# Patient Record
Sex: Female | Born: 1980 | Race: White | Hispanic: No | Smoking: Current every day smoker
Health system: Southern US, Community
[De-identification: ages and names within clinical notes are randomized; demographics above are authoritative.]

## PROBLEM LIST (undated history)

## (undated) DIAGNOSIS — F32A Depression, unspecified: Secondary | ICD-10-CM

## (undated) DIAGNOSIS — M549 Dorsalgia, unspecified: Secondary | ICD-10-CM

## (undated) DIAGNOSIS — F329 Major depressive disorder, single episode, unspecified: Secondary | ICD-10-CM

## (undated) DIAGNOSIS — F419 Anxiety disorder, unspecified: Secondary | ICD-10-CM

## (undated) DIAGNOSIS — R55 Syncope and collapse: Secondary | ICD-10-CM

## (undated) DIAGNOSIS — R51 Headache: Secondary | ICD-10-CM

## (undated) DIAGNOSIS — R519 Headache, unspecified: Secondary | ICD-10-CM

## (undated) DIAGNOSIS — I951 Orthostatic hypotension: Secondary | ICD-10-CM

## (undated) HISTORY — DX: Syncope and collapse: R55

## (undated) HISTORY — PX: TONSILLECTOMY AND ADENOIDECTOMY: SUR1326

## (undated) HISTORY — DX: Headache, unspecified: R51.9

## (undated) HISTORY — DX: Orthostatic hypotension: I95.1

## (undated) HISTORY — PX: APPENDECTOMY: SHX54

## (undated) HISTORY — DX: Headache: R51

## (undated) HISTORY — PX: FACIAL RECONSTRUCTION SURGERY: SHX631

---

## 2009-01-18 ENCOUNTER — Emergency Department: Payer: Self-pay | Admitting: Emergency Medicine

## 2011-12-13 ENCOUNTER — Encounter (HOSPITAL_COMMUNITY): Payer: Self-pay | Admitting: *Deleted

## 2011-12-13 ENCOUNTER — Emergency Department (HOSPITAL_COMMUNITY)
Admission: EM | Admit: 2011-12-13 | Discharge: 2011-12-13 | Disposition: A | Discharge status: Home / Self Care | Payer: 59 | Source: Home / Self Care | Attending: Emergency Medicine | Admitting: Emergency Medicine

## 2011-12-13 DIAGNOSIS — J329 Chronic sinusitis, unspecified: Secondary | ICD-10-CM

## 2011-12-13 MED ORDER — AMOXICILLIN-POT CLAVULANATE 875-125 MG PO TABS: 1.0000 | ORAL_TABLET | Freq: Two times a day (BID) | ORAL | Status: AC

## 2011-12-13 NOTE — ED Notes (Signed)
Pt  Reports  She  Had  Facial  Surgery    Last  Year  -  She  Reports   She  Had  Complications  From the  Surgery  -  She   Reports  Several  Days  Ago  She  Felt  Some  Pressure  In  Her   Face      With  Pain and  Swelling  -  She   denys  Having  Any     Uri      Symptoms           She    Is  Sitting  Upright on  Exam table   Speaking in  Complete  sentances          She  Reports  She  Has  An appt in  Early  July  With  The    Surgeon

## 2011-12-13 NOTE — Discharge Instructions (Signed)
Follow up with your doctor in [facility] as scheduled.  Take all of the augmentin even if you are feeling better.  Return here or your regular doctor if your symptoms worsen.    Sinusitis Sinuses are air pockets within the bones of your face. The growth of bacteria within a sinus leads to infection. The infection prevents the sinuses from draining. This infection is called sinusitis. SYMPTOMS  There will be different areas of pain depending on which sinuses have become infected.  The maxillary sinuses often produce pain beneath the eyes.   Frontal sinusitis may cause pain in the middle of the forehead and above the eyes.  Other problems (symptoms) include:  Toothaches.   Colored, pus-like (purulent) drainage from the nose.   Swelling, warmth, and tenderness over the sinus areas may be signs of infection.  TREATMENT  Sinusitis is most often determined by an exam.X-rays may be taken. If x-rays have been taken, make sure you obtain your results or find out how you are to obtain them. Your caregiver may give you medications (antibiotics). These are medications that will help kill the bacteria causing the infection. You may also be given a medication (decongestant) that helps to reduce sinus swelling.  HOME CARE INSTRUCTIONS   Only take over-the-counter or prescription medicines for pain, discomfort, or fever as directed by your caregiver.   Drink extra fluids. Fluids help thin the mucus so your sinuses can drain more easily.   Applying either moist heat or ice packs to the sinus areas may help relieve discomfort.   Use saline nasal sprays to help moisten your sinuses. The sprays can be found at your local drugstore.  SEEK IMMEDIATE MEDICAL CARE IF:  You have a fever.   You have increasing pain, severe headaches, or toothache.   You have nausea, vomiting, or drowsiness.   You develop unusual swelling around the face or trouble seeing.  MAKE SURE YOU:   Understand these  instructions.   Will watch your condition.   Will get help right away if you are not doing well or get worse.  Document Released: 06/20/2005 Document Revised: 06/09/2011 Document Reviewed: 01/17/2007 [facility] Patient Information 2012 [place], [place].

## 2011-12-13 NOTE — ED Provider Notes (Signed)
Medical screening examination/treatment/procedure(s) were performed by non-physician practitioner and as supervising physician I was immediately available for consultation/collaboration.  Leslee Home, M.D.   Reuben Likes, MD 12/13/11 2118
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
## 2012-01-04 ENCOUNTER — Other Ambulatory Visit (HOSPITAL_COMMUNITY): Payer: Self-pay | Admitting: Dentistry

## 2012-01-04 ENCOUNTER — Ambulatory Visit (HOSPITAL_COMMUNITY)
Admission: RE | Admit: 2012-01-04 | Discharge: 2012-01-04 | Disposition: A | Discharge status: Home / Self Care | Payer: 59 | Source: Ambulatory Visit | Attending: Dentistry | Admitting: Dentistry

## 2012-01-04 DIAGNOSIS — X58XXXA Exposure to other specified factors, initial encounter: Secondary | ICD-10-CM | POA: Insufficient documentation

## 2012-01-04 DIAGNOSIS — Z1810 Retained metal fragments, unspecified: Secondary | ICD-10-CM | POA: Insufficient documentation

## 2012-01-04 DIAGNOSIS — IMO0002 Reserved for concepts with insufficient information to code with codable children: Secondary | ICD-10-CM

## 2012-01-04 DIAGNOSIS — T1490XA Injury, unspecified, initial encounter: Secondary | ICD-10-CM | POA: Insufficient documentation

## 2012-05-12 ENCOUNTER — Emergency Department (HOSPITAL_COMMUNITY): Payer: No Typology Code available for payment source

## 2012-05-12 ENCOUNTER — Emergency Department (HOSPITAL_COMMUNITY)
Admission: EM | Admit: 2012-05-12 | Discharge: 2012-05-12 | Disposition: A | Discharge status: Home / Self Care | Payer: No Typology Code available for payment source | Attending: Emergency Medicine | Admitting: Emergency Medicine

## 2012-05-12 ENCOUNTER — Encounter (HOSPITAL_COMMUNITY): Payer: Self-pay | Admitting: Physical Medicine and Rehabilitation

## 2012-05-12 DIAGNOSIS — T07XXXA Unspecified multiple injuries, initial encounter: Secondary | ICD-10-CM | POA: Insufficient documentation

## 2012-05-12 DIAGNOSIS — Z3202 Encounter for pregnancy test, result negative: Secondary | ICD-10-CM | POA: Insufficient documentation

## 2012-05-12 DIAGNOSIS — R109 Unspecified abdominal pain: Secondary | ICD-10-CM | POA: Insufficient documentation

## 2012-05-12 DIAGNOSIS — Y92410 Unspecified street and highway as the place of occurrence of the external cause: Secondary | ICD-10-CM | POA: Insufficient documentation

## 2012-05-12 DIAGNOSIS — M549 Dorsalgia, unspecified: Secondary | ICD-10-CM | POA: Insufficient documentation

## 2012-05-12 DIAGNOSIS — R079 Chest pain, unspecified: Secondary | ICD-10-CM | POA: Insufficient documentation

## 2012-05-12 DIAGNOSIS — Y9389 Activity, other specified: Secondary | ICD-10-CM | POA: Insufficient documentation

## 2012-05-12 LAB — URINE MICROSCOPIC-ADD ON

## 2012-05-12 LAB — POCT I-STAT, CHEM 8
Creatinine, Ser: 0.8 mg/dL (ref 0.50–1.10)
Glucose, Bld: 103 mg/dL — ABNORMAL HIGH (ref 70–99)
HCT: 40 % (ref 36.0–46.0)
Potassium: 3.8 mEq/L (ref 3.5–5.1)

## 2012-05-12 LAB — URINALYSIS, ROUTINE W REFLEX MICROSCOPIC
Nitrite: NEGATIVE
Specific Gravity, Urine: 1.035 — ABNORMAL HIGH (ref 1.005–1.030)
Urobilinogen, UA: 0.2 mg/dL (ref 0.0–1.0)

## 2012-05-12 LAB — POCT PREGNANCY, URINE: Preg Test, Ur: NEGATIVE

## 2012-05-12 MED ORDER — IOHEXOL 300 MG/ML  SOLN
100.0000 mL | Freq: Once | INTRAMUSCULAR | Status: AC | PRN
  Administered 2012-05-12: 100 mL via INTRAVENOUS

## 2012-05-12 MED ORDER — HYDROCODONE-ACETAMINOPHEN 5-325 MG PO TABS: 1.0000 | ORAL_TABLET | ORAL | Status: DC | PRN

## 2012-05-12 MED ORDER — IBUPROFEN 800 MG PO TABS: 800.0000 mg | ORAL_TABLET | Freq: Three times a day (TID) | ORAL | Status: DC | PRN

## 2012-05-12 MED ORDER — DIAZEPAM 5 MG PO TABS: 5.0000 mg | ORAL_TABLET | Freq: Three times a day (TID) | ORAL | Status: DC | PRN
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
## 2012-05-12 NOTE — ED Notes (Signed)
Family at bedside. 
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
## 2012-05-12 NOTE — Progress Notes (Signed)
Orthopedic Tech Progress Note Patient Details:  Sandra Myers 08/16/1980 [id number]  Ortho Devices Type of Ortho Device: Velcro wrist splint Ortho Device/Splint Location: left wrist Ortho Device/Splint Interventions: Application   [name] 05/12/2012, 2:19 PM
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
## 2012-05-12 NOTE — ED Provider Notes (Signed)
Medical screening examination/treatment/procedure(s) were conducted as a shared visit with non-physician practitioner(s) and myself.  I personally evaluated the patient during the encounter   Karry Causer, MD 05/12/12 1634 
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
## 2012-06-29 ENCOUNTER — Other Ambulatory Visit (HOSPITAL_COMMUNITY): Payer: Self-pay | Admitting: Emergency Medicine

## 2012-06-29 ENCOUNTER — Ambulatory Visit (HOSPITAL_COMMUNITY)
Admission: RE | Admit: 2012-06-29 | Discharge: 2012-06-29 | Disposition: A | Discharge status: Home / Self Care | Payer: 59 | Source: Ambulatory Visit | Attending: Emergency Medicine | Admitting: Emergency Medicine

## 2012-06-29 DIAGNOSIS — S6990XA Unspecified injury of unspecified wrist, hand and finger(s), initial encounter: Secondary | ICD-10-CM | POA: Insufficient documentation

## 2012-06-29 DIAGNOSIS — R52 Pain, unspecified: Secondary | ICD-10-CM

## 2012-06-29 DIAGNOSIS — X58XXXA Exposure to other specified factors, initial encounter: Secondary | ICD-10-CM | POA: Insufficient documentation

## 2012-08-23 ENCOUNTER — Ambulatory Visit: Payer: PRIVATE HEALTH INSURANCE | Attending: Family Medicine

## 2012-08-23 DIAGNOSIS — IMO0001 Reserved for inherently not codable concepts without codable children: Secondary | ICD-10-CM | POA: Insufficient documentation

## 2012-08-23 DIAGNOSIS — M545 Low back pain, unspecified: Secondary | ICD-10-CM | POA: Insufficient documentation

## 2012-08-23 DIAGNOSIS — R5381 Other malaise: Secondary | ICD-10-CM | POA: Insufficient documentation

## 2012-08-28 ENCOUNTER — Ambulatory Visit: Payer: PRIVATE HEALTH INSURANCE | Admitting: Physical Therapy

## 2012-08-30 ENCOUNTER — Ambulatory Visit: Payer: PRIVATE HEALTH INSURANCE

## 2012-09-03 ENCOUNTER — Ambulatory Visit: Payer: 59 | Admitting: Physical Therapy

## 2012-09-03 ENCOUNTER — Ambulatory Visit: Payer: PRIVATE HEALTH INSURANCE | Attending: Family Medicine | Admitting: Physical Therapy

## 2012-09-03 DIAGNOSIS — R5381 Other malaise: Secondary | ICD-10-CM | POA: Insufficient documentation

## 2012-09-03 DIAGNOSIS — M545 Low back pain, unspecified: Secondary | ICD-10-CM | POA: Insufficient documentation

## 2012-09-03 DIAGNOSIS — IMO0001 Reserved for inherently not codable concepts without codable children: Secondary | ICD-10-CM | POA: Insufficient documentation

## 2012-09-05 ENCOUNTER — Ambulatory Visit: Payer: PRIVATE HEALTH INSURANCE | Admitting: Physical Therapy

## 2012-09-10 ENCOUNTER — Ambulatory Visit: Payer: PRIVATE HEALTH INSURANCE | Admitting: Physical Therapy

## 2012-09-12 ENCOUNTER — Ambulatory Visit: Payer: PRIVATE HEALTH INSURANCE | Admitting: Physical Therapy

## 2012-09-17 ENCOUNTER — Ambulatory Visit: Payer: PRIVATE HEALTH INSURANCE | Admitting: Physical Therapy

## 2012-09-19 ENCOUNTER — Ambulatory Visit: Payer: Self-pay

## 2012-09-19 ENCOUNTER — Other Ambulatory Visit: Payer: Self-pay | Admitting: Occupational Medicine

## 2012-09-19 ENCOUNTER — Ambulatory Visit: Payer: PRIVATE HEALTH INSURANCE | Admitting: Physical Therapy

## 2012-09-19 DIAGNOSIS — M549 Dorsalgia, unspecified: Secondary | ICD-10-CM

## 2012-09-20 ENCOUNTER — Other Ambulatory Visit (HOSPITAL_COMMUNITY): Payer: Self-pay | Admitting: Family Medicine

## 2012-09-21 ENCOUNTER — Encounter (HOSPITAL_COMMUNITY): Payer: Self-pay

## 2012-09-21 ENCOUNTER — Ambulatory Visit (HOSPITAL_COMMUNITY)
Admission: RE | Admit: 2012-09-21 | Discharge: 2012-09-21 | Disposition: A | Discharge status: Home / Self Care | Payer: PRIVATE HEALTH INSURANCE | Source: Ambulatory Visit | Attending: Family Medicine | Admitting: Family Medicine

## 2012-09-21 VITALS — BP 124/80 | HR 85 | Temp 98.4°F | Resp 16

## 2012-09-21 DIAGNOSIS — T148XXA Other injury of unspecified body region, initial encounter: Secondary | ICD-10-CM

## 2012-09-21 DIAGNOSIS — M25569 Pain in unspecified knee: Secondary | ICD-10-CM | POA: Insufficient documentation

## 2012-09-21 DIAGNOSIS — M545 Low back pain, unspecified: Secondary | ICD-10-CM | POA: Insufficient documentation

## 2012-09-21 DIAGNOSIS — M25559 Pain in unspecified hip: Secondary | ICD-10-CM | POA: Insufficient documentation

## 2012-09-21 HISTORY — DX: Dorsalgia, unspecified: M54.9

## 2012-09-21 MED ORDER — MIDAZOLAM HCL 2 MG/2ML IJ SOLN
INTRAMUSCULAR | Status: AC
  Filled 2012-09-21: qty 10

## 2012-09-21 MED ORDER — FENTANYL CITRATE 0.05 MG/ML IJ SOLN
25.0000 ug | INTRAMUSCULAR | Status: DC | PRN
  Administered 2012-09-21 (×4): 50 ug via INTRAVENOUS
  Filled 2012-09-21: qty 4

## 2012-09-21 MED ORDER — FENTANYL CITRATE 0.05 MG/ML IJ SOLN
INTRAMUSCULAR | Status: AC
  Filled 2012-09-21: qty 4

## 2012-09-21 MED ORDER — MIDAZOLAM HCL 2 MG/2ML IJ SOLN
1.0000 mg | INTRAMUSCULAR | Status: DC | PRN
  Administered 2012-09-21 (×5): 2 mg via INTRAVENOUS
  Filled 2012-09-21: qty 10
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
## 2012-09-24 ENCOUNTER — Other Ambulatory Visit (HOSPITAL_COMMUNITY): Payer: Self-pay

## 2012-09-24 ENCOUNTER — Ambulatory Visit: Payer: PRIVATE HEALTH INSURANCE | Admitting: Physical Therapy

## 2012-09-26 ENCOUNTER — Ambulatory Visit: Payer: PRIVATE HEALTH INSURANCE

## 2012-10-10 ENCOUNTER — Ambulatory Visit: Payer: PRIVATE HEALTH INSURANCE | Attending: Family Medicine | Admitting: Physical Therapy

## 2012-10-10 DIAGNOSIS — M545 Low back pain, unspecified: Secondary | ICD-10-CM | POA: Insufficient documentation

## 2012-10-10 DIAGNOSIS — R5381 Other malaise: Secondary | ICD-10-CM | POA: Insufficient documentation

## 2012-10-10 DIAGNOSIS — IMO0001 Reserved for inherently not codable concepts without codable children: Secondary | ICD-10-CM | POA: Insufficient documentation

## 2012-10-15 ENCOUNTER — Ambulatory Visit: Payer: PRIVATE HEALTH INSURANCE | Admitting: Physical Therapy

## 2012-10-17 ENCOUNTER — Ambulatory Visit: Payer: PRIVATE HEALTH INSURANCE | Admitting: Physical Therapy

## 2012-10-23 ENCOUNTER — Ambulatory Visit: Payer: PRIVATE HEALTH INSURANCE | Admitting: Physical Therapy

## 2012-10-24 ENCOUNTER — Ambulatory Visit: Payer: PRIVATE HEALTH INSURANCE | Admitting: Physical Therapy

## 2012-10-30 ENCOUNTER — Ambulatory Visit: Payer: PRIVATE HEALTH INSURANCE | Admitting: Physical Therapy

## 2012-10-31 ENCOUNTER — Ambulatory Visit: Payer: PRIVATE HEALTH INSURANCE | Admitting: Physical Therapy

## 2012-11-05 ENCOUNTER — Ambulatory Visit: Payer: PRIVATE HEALTH INSURANCE | Attending: Family Medicine | Admitting: Physical Therapy

## 2012-11-05 DIAGNOSIS — M545 Low back pain, unspecified: Secondary | ICD-10-CM | POA: Insufficient documentation

## 2012-11-05 DIAGNOSIS — IMO0001 Reserved for inherently not codable concepts without codable children: Secondary | ICD-10-CM | POA: Insufficient documentation

## 2012-11-05 DIAGNOSIS — R5381 Other malaise: Secondary | ICD-10-CM | POA: Insufficient documentation

## 2012-11-07 ENCOUNTER — Ambulatory Visit: Payer: PRIVATE HEALTH INSURANCE

## 2013-01-12 ENCOUNTER — Emergency Department (HOSPITAL_BASED_OUTPATIENT_CLINIC_OR_DEPARTMENT_OTHER)
Admission: EM | Admit: 2013-01-12 | Discharge: 2013-01-13 | Disposition: A | Discharge status: Home / Self Care | Payer: 59 | Attending: Emergency Medicine | Admitting: Emergency Medicine

## 2013-01-12 ENCOUNTER — Emergency Department (HOSPITAL_BASED_OUTPATIENT_CLINIC_OR_DEPARTMENT_OTHER): Payer: 59

## 2013-01-12 ENCOUNTER — Encounter (HOSPITAL_BASED_OUTPATIENT_CLINIC_OR_DEPARTMENT_OTHER): Payer: Self-pay | Admitting: *Deleted

## 2013-01-12 DIAGNOSIS — R42 Dizziness and giddiness: Secondary | ICD-10-CM | POA: Insufficient documentation

## 2013-01-12 DIAGNOSIS — Y939 Activity, unspecified: Secondary | ICD-10-CM | POA: Insufficient documentation

## 2013-01-12 DIAGNOSIS — W57XXXA Bitten or stung by nonvenomous insect and other nonvenomous arthropods, initial encounter: Secondary | ICD-10-CM | POA: Insufficient documentation

## 2013-01-12 DIAGNOSIS — R197 Diarrhea, unspecified: Secondary | ICD-10-CM | POA: Insufficient documentation

## 2013-01-12 DIAGNOSIS — R0602 Shortness of breath: Secondary | ICD-10-CM | POA: Insufficient documentation

## 2013-01-12 DIAGNOSIS — R110 Nausea: Secondary | ICD-10-CM | POA: Insufficient documentation

## 2013-01-12 DIAGNOSIS — S30860A Insect bite (nonvenomous) of lower back and pelvis, initial encounter: Secondary | ICD-10-CM | POA: Insufficient documentation

## 2013-01-12 DIAGNOSIS — Y929 Unspecified place or not applicable: Secondary | ICD-10-CM | POA: Insufficient documentation

## 2013-01-12 DIAGNOSIS — J9801 Acute bronchospasm: Secondary | ICD-10-CM | POA: Insufficient documentation

## 2013-01-12 DIAGNOSIS — Z87891 Personal history of nicotine dependence: Secondary | ICD-10-CM | POA: Insufficient documentation

## 2013-01-12 LAB — CBC WITH DIFFERENTIAL/PLATELET
Basophils Absolute: 0 10*3/uL (ref 0.0–0.1)
Lymphocytes Relative: 21 % (ref 12–46)
Lymphs Abs: 2.8 10*3/uL (ref 0.7–4.0)
Neutro Abs: 8.8 10*3/uL — ABNORMAL HIGH (ref 1.7–7.7)
Neutrophils Relative %: 67 % (ref 43–77)
Platelets: 216 10*3/uL (ref 150–400)
RBC: 4.53 MIL/uL (ref 3.87–5.11)
RDW: 12.5 % (ref 11.5–15.5)
WBC: 13.2 10*3/uL — ABNORMAL HIGH (ref 4.0–10.5)

## 2013-01-12 MED ORDER — ALBUTEROL SULFATE (5 MG/ML) 0.5% IN NEBU
5.0000 mg | INHALATION_SOLUTION | Freq: Once | RESPIRATORY_TRACT | Status: AC
  Administered 2013-01-12: 5 mg via RESPIRATORY_TRACT
  Filled 2013-01-12 (×2): qty 0.5

## 2013-01-12 MED ORDER — DIPHENHYDRAMINE HCL 50 MG/ML IJ SOLN
25.0000 mg | Freq: Once | INTRAMUSCULAR | Status: AC
  Administered 2013-01-13: 25 mg via INTRAVENOUS
  Filled 2013-01-12: qty 1

## 2013-01-12 MED ORDER — IPRATROPIUM BROMIDE 0.02 % IN SOLN
0.5000 mg | Freq: Once | RESPIRATORY_TRACT | Status: AC
  Administered 2013-01-12: 0.5 mg via RESPIRATORY_TRACT
  Filled 2013-01-12: qty 2.5

## 2013-01-12 NOTE — ED Provider Notes (Signed)
History     This chart was scribed for Hanley Seamen, MD by Jiles Prows, ED Scribe. The patient was seen in room MH11/MH11 and the patient's care was started at 11:17 PM.  CSN: [id number] Arrival date & time 01/12/13  2115  Chief Complaint  Patient presents with  . Chest Pain   The history is provided by the patient and medical records. No language interpreter was used.   HPI Comments: Sandra Myers is a 32 y.o. female who presents to the Emergency Department complaining of dizziness (onset noon today), chest tightness, and shortness of breath onset today around 6-7.  She ranks the chest tightness at a 7-8/10 and states it is "hard to take a deep breath."  She describes associated nausea, but denies any vomiting.  Pt reports bug bite to left buttock last Sunday that has become red and spread to both buttocks.  She claims there is a welt in the middle of the bite area.  Pt reports using benadryl cream on rash area with no relief.  Pt reports nausea (onset today) and diarrhea (onset 2 days ago).  Pt denies headache, diaphoresis, fever, chills, vomiting, weakness, cough and any other pain.   Past Medical History  Diagnosis Date  . Back pain    Past Surgical History  Procedure Laterality Date  . Facial reconstruction surgery    . Appendectomy    . Tonsillectomy and adenoidectomy     History reviewed. No pertinent family history. History  Substance Use Topics  . Smoking status: Former Smoker    Types: Cigarettes    Quit date: 01/16/2003  . Smokeless tobacco: Not on file  . Alcohol Use: Yes     Comment: OCCASIONALLY   OB History   Grav Para Term Preterm Abortions TAB SAB Ect Mult Living                 Review of Systems  Respiratory: Positive for chest tightness and shortness of breath.   Skin: Positive for rash.  Neurological: Positive for dizziness.  All other systems reviewed and are negative.    Allergies  Review of patient's allergies indicates no known  allergies.  Home Medications   Current Outpatient Rx  Name  Route  Sig  Dispense  Refill  . meloxicam (MOBIC) 15 MG tablet   Oral   Take 15 mg by mouth daily.          BP 145/94  Pulse 89  Temp(Src) 98.3 F (36.8 C) (Oral)  Resp 18  Ht 5\' 7"  (1.702 m)  Wt 210 lb (95.255 kg)  BMI 32.88 kg/m2  SpO2 100%  LMP 01/04/2013 Physical Exam  Nursing note and vitals reviewed. Constitutional: She is oriented to person, place, and time. She appears well-developed and well-nourished. No distress.  HENT:  Head: Normocephalic and atraumatic.  Eyes: EOM are normal.  Neck: Neck supple. No tracheal deviation present.  Cardiovascular: Normal rate, regular rhythm and normal heart sounds.  Exam reveals no gallop and no friction rub.   No murmur heard. Pulmonary/Chest: Effort normal and breath sounds normal. No respiratory distress. She has no wheezes. She has no rales. She exhibits no tenderness.  Lungs are clear, but she cannot take a full deep breath.  Abdominal: She exhibits no distension. There is no tenderness. There is no rebound.  Musculoskeletal: Normal range of motion.  Neurological: She is alert and oriented to person, place, and time.  Skin: Skin is warm and dry. Rash noted.  Erythematous  plaques of left buttock and superior aspect of naval cleft.  Mildly indurated, nontender.    Psychiatric: She has a normal mood and affect. Her behavior is normal.    ED Course  Procedures (including critical care time) DIAGNOSTIC STUDIES: Oxygen Saturation is 100% on RA, normal by my interpretation.    COORDINATION OF CARE: 11:24 PM - Discussed ED treatment with pt at bedside including antibiotics, blood work, EKG, and breathing treatment and pt agrees.    MDM   Nursing notes and vitals signs, including pulse oximetry, reviewed.  Summary of this visit's results, reviewed by myself:  Labs:  Results for orders placed during the hospital encounter of 01/12/13 (from the past 24 hour(s))   CBC WITH DIFFERENTIAL     Status: Abnormal   Collection Time    01/12/13 11:40 PM      Result Value Range   WBC 13.2 (*) 4.0 - 10.5 K/uL   RBC 4.53  3.87 - 5.11 MIL/uL   Hemoglobin 13.8  12.0 - 15.0 g/dL   HCT 84.6  96.2 - 95.2 %   MCV 87.4  78.0 - 100.0 fL   MCH 30.5  26.0 - 34.0 pg   MCHC 34.8  30.0 - 36.0 g/dL   RDW 84.1  32.4 - 40.1 %   Platelets 216  150 - 400 K/uL   Neutrophils Relative % 67  43 - 77 %   Neutro Abs 8.8 (*) 1.7 - 7.7 K/uL   Lymphocytes Relative 21  12 - 46 %   Lymphs Abs 2.8  0.7 - 4.0 K/uL   Monocytes Relative 10  3 - 12 %   Monocytes Absolute 1.3 (*) 0.1 - 1.0 K/uL   Eosinophils Relative 2  0 - 5 %   Eosinophils Absolute 0.3  0.0 - 0.7 K/uL   Basophils Relative 0  0 - 1 %   Basophils Absolute 0.0  0.0 - 0.1 K/uL  BASIC METABOLIC PANEL     Status: Abnormal   Collection Time    01/12/13 11:40 PM      Result Value Range   Sodium 137  135 - 145 mEq/L   Potassium 3.6  3.5 - 5.1 mEq/L   Chloride 103  96 - 112 mEq/L   CO2 20  19 - 32 mEq/L   Glucose, Bld 94  70 - 99 mg/dL   BUN 19  6 - 23 mg/dL   Creatinine, Ser 0.27  0.50 - 1.10 mg/dL   Calcium 9.9  8.4 - 25.3 mg/dL   GFR calc non Af Amer 82 (*) >90 mL/min   GFR calc Af Amer >90  >90 mL/min  TROPONIN I     Status: None   Collection Time    01/12/13 11:40 PM      Result Value Range   Troponin I <0.30  <0.30 ng/mL    Imaging Studies: Dg Chest 2 View  01/13/2013   *RADIOLOGY REPORT*  Clinical Data: Chest tightness, shortness of breath and dizziness.  CHEST - 2 VIEW  Comparison: 05/12/2012.  Findings: Normal sized heart.  Clear lungs.  Normal appearing bones.  IMPRESSION: Normal examination.   Original Report Authenticated By: Beckie Salts, M.D.       Date: 01/12/2013 11:32 PM  Rate: 82  Rhythm: normal sinus rhythm  QRS Axis: normal  Intervals: normal  ST/T Wave abnormalities: normal  Conduction Disutrbances: none  Narrative Interpretation: unremarkable  Comparison with previous EKG: none  available  12:33 AM Patient feels better  with improved air movement and reduced chest tightness after albuterol and Atrovent neb treatment. We will treat her rash with doxycycline as this may represent an atypical presentation of Lyme disease; doxycycline should also treat cellulitis although lack of tenderness and warmth is less consistent with cellulitis. The patient's nausea and dizziness have improved with IV Benadryl. She was advised to continue taking Benadryl by mouth as needed for itching or dizziness.     I personally performed the services described in this documentation, which was scribed in my presence.  The recorded information has been reviewed and is accurate.    Hanley Seamen, MD 01/13/13 [phone]

## 2013-01-12 NOTE — ED Notes (Signed)
Transported to xray 

## 2013-01-12 NOTE — ED Notes (Signed)
Patient to restroom without difficulty or assistance

## 2013-01-12 NOTE — ED Notes (Signed)
MD with pt  

## 2013-01-12 NOTE — ED Notes (Signed)
Further assessment of "bug bite" to buttocks. Several red areas noted that appear to have some clustering of bumps. Pt states the areas itch. Denies hx of shingles.

## 2013-01-12 NOTE — ED Provider Notes (Signed)
9:29 PM  Date: 01/12/2013  Rate: 86  Rhythm: normal sinus rhythm  QRS Axis: normal  Intervals: normal  ST/T Wave abnormalities: normal  Conduction Disutrbances:none  Narrative Interpretation: Normal EKG  Old EKG Reviewed: none available    Carleene Cooper III, MD 01/12/13 2129

## 2013-01-12 NOTE — ED Notes (Signed)
Pt states she was ? Bitten by an insect last Sunday and has now developed a rash. Also describes dizziness, chest tightness, SHOB onset 12 noon. Taken to ED 11. EKG done.

## 2013-01-13 LAB — BASIC METABOLIC PANEL
CO2: 20 mEq/L (ref 19–32)
Calcium: 9.9 mg/dL (ref 8.4–10.5)
Chloride: 103 mEq/L (ref 96–112)
Potassium: 3.6 mEq/L (ref 3.5–5.1)
Sodium: 137 mEq/L (ref 135–145)

## 2013-01-13 LAB — TROPONIN I: Troponin I: <0.3 ng/mL (ref <0.30)

## 2013-01-13 MED ORDER — DOXYCYCLINE HYCLATE 100 MG PO TABS
100.0000 mg | ORAL_TABLET | Freq: Once | ORAL | Status: AC
  Administered 2013-01-13: 100 mg via ORAL
  Filled 2013-01-13: qty 1

## 2013-01-13 MED ORDER — ALBUTEROL SULFATE HFA 108 (90 BASE) MCG/ACT IN AERS
2.0000 | INHALATION_SPRAY | RESPIRATORY_TRACT | Status: DC | PRN
  Administered 2013-01-13: 2 via RESPIRATORY_TRACT
  Filled 2013-01-13: qty 6.7

## 2013-01-13 MED ORDER — DOXYCYCLINE HYCLATE 100 MG PO CAPS: 100.0000 mg | ORAL_CAPSULE | Freq: Two times a day (BID) | ORAL | Status: DC

## 2013-01-13 NOTE — ED Notes (Signed)
Returned from xray

## 2013-01-13 NOTE — ED Notes (Signed)
Breathing treatment in progress

## 2013-01-16 ENCOUNTER — Ambulatory Visit (INDEPENDENT_AMBULATORY_CARE_PROVIDER_SITE_OTHER): Payer: 59 | Admitting: Family Medicine

## 2013-01-16 VITALS — BP 118/72 | HR 82 | Temp 99.2°F | Resp 18 | Ht 67.0 in | Wt 217.4 lb

## 2013-01-16 DIAGNOSIS — F329 Major depressive disorder, single episode, unspecified: Secondary | ICD-10-CM

## 2013-01-16 DIAGNOSIS — Z131 Encounter for screening for diabetes mellitus: Secondary | ICD-10-CM

## 2013-01-16 DIAGNOSIS — R21 Rash and other nonspecific skin eruption: Secondary | ICD-10-CM

## 2013-01-16 DIAGNOSIS — F32A Depression, unspecified: Secondary | ICD-10-CM

## 2013-01-16 DIAGNOSIS — F341 Dysthymic disorder: Secondary | ICD-10-CM

## 2013-01-16 DIAGNOSIS — L0390 Cellulitis, unspecified: Secondary | ICD-10-CM

## 2013-01-16 LAB — POCT CBC
Granulocyte percent: 68.4 %G (ref 37–80)
HCT, POC: 44 % (ref 37.7–47.9)
Hemoglobin: 14.1 g/dL (ref 12.2–16.2)
Lymph, poc: 2.1 (ref 0.6–3.4)
MCH, POC: 30.1 pg (ref 27–31.2)
MCHC: 32 g/dL (ref 31.8–35.4)
MCV: 93.8 fL (ref 80–97)
MID (cbc): 0.6 (ref 0–0.9)
MPV: 9.2 fL (ref 0–99.8)
POC Granulocyte: 5.9 (ref 2–6.9)
POC LYMPH PERCENT: 24.6 %L (ref 10–50)
POC MID %: 7 % (ref 0–12)
Platelet Count, POC: 231 10*3/uL (ref 142–424)
RBC: 4.69 M/uL (ref 4.04–5.48)
RDW, POC: 13.5 %
WBC: 8.6 10*3/uL (ref 4.6–10.2)

## 2013-01-16 LAB — POCT GLYCOSYLATED HEMOGLOBIN (HGB A1C): Hemoglobin A1C: 5

## 2013-01-16 MED ORDER — CITALOPRAM HYDROBROMIDE 20 MG PO TABS: 20.0000 mg | ORAL_TABLET | Freq: Every day | ORAL | Status: DC

## 2013-01-16 MED ORDER — TRIAMCINOLONE ACETONIDE 0.1 % EX CREA: TOPICAL_CREAM | Freq: Two times a day (BID) | CUTANEOUS | Status: DC

## 2013-01-16 MED ORDER — CLONAZEPAM 0.5 MG PO TABS: 0.5000 mg | ORAL_TABLET | Freq: Two times a day (BID) | ORAL | Status: DC | PRN
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
## 2013-01-17 LAB — TSH: TSH: 1.009 u[IU]/mL (ref 0.350–4.500)

## 2013-02-05 ENCOUNTER — Inpatient Hospital Stay (HOSPITAL_COMMUNITY)
Admission: RE | Admit: 2013-02-05 | Discharge: 2013-02-08 | DRG: 885 | Disposition: A | Discharge status: Home / Self Care | Payer: 59 | Attending: Psychiatry | Admitting: Psychiatry

## 2013-02-05 ENCOUNTER — Encounter (HOSPITAL_COMMUNITY): Payer: Self-pay | Admitting: Behavioral Health

## 2013-02-05 DIAGNOSIS — F329 Major depressive disorder, single episode, unspecified: Secondary | ICD-10-CM

## 2013-02-05 DIAGNOSIS — R45851 Suicidal ideations: Secondary | ICD-10-CM

## 2013-02-05 DIAGNOSIS — F4323 Adjustment disorder with mixed anxiety and depressed mood: Secondary | ICD-10-CM

## 2013-02-05 DIAGNOSIS — F411 Generalized anxiety disorder: Secondary | ICD-10-CM | POA: Diagnosis present

## 2013-02-05 DIAGNOSIS — F332 Major depressive disorder, recurrent severe without psychotic features: Principal | ICD-10-CM | POA: Diagnosis present

## 2013-02-05 DIAGNOSIS — Z79899 Other long term (current) drug therapy: Secondary | ICD-10-CM

## 2013-02-05 HISTORY — DX: Major depressive disorder, single episode, unspecified: F32.9

## 2013-02-05 HISTORY — DX: Depression, unspecified: F32.A

## 2013-02-05 HISTORY — DX: Anxiety disorder, unspecified: F41.9

## 2013-02-05 MED ORDER — THIAMINE HCL 100 MG/ML IJ SOLN: 100.0000 mg | Freq: Once | INTRAMUSCULAR | Status: DC

## 2013-02-05 MED ORDER — ADULT MULTIVITAMIN W/MINERALS CH
1.0000 | ORAL_TABLET | Freq: Every day | ORAL | Status: DC
  Administered 2013-02-05 – 2013-02-06 (×2): 1 via ORAL
  Filled 2013-02-05 (×4): qty 1

## 2013-02-05 MED ORDER — MAGNESIUM HYDROXIDE 400 MG/5ML PO SUSP: 30.0000 mL | Freq: Every day | ORAL | Status: DC | PRN

## 2013-02-05 MED ORDER — CHLORDIAZEPOXIDE HCL 25 MG PO CAPS: 25.0000 mg | ORAL_CAPSULE | Freq: Every day | ORAL | Status: DC

## 2013-02-05 MED ORDER — ALUM & MAG HYDROXIDE-SIMETH 200-200-20 MG/5ML PO SUSP: 30.0000 mL | ORAL | Status: DC | PRN

## 2013-02-05 MED ORDER — CHLORDIAZEPOXIDE HCL 25 MG PO CAPS: 25.0000 mg | ORAL_CAPSULE | Freq: Four times a day (QID) | ORAL | Status: DC

## 2013-02-05 MED ORDER — LOPERAMIDE HCL 2 MG PO CAPS: 2.0000 mg | ORAL_CAPSULE | ORAL | Status: DC | PRN

## 2013-02-05 MED ORDER — MELOXICAM 15 MG PO TABS
15.0000 mg | ORAL_TABLET | Freq: Every day | ORAL | Status: DC
  Filled 2013-02-05 (×2): qty 1

## 2013-02-05 MED ORDER — TRAZODONE HCL 50 MG PO TABS
50.0000 mg | ORAL_TABLET | Freq: Every day | ORAL | Status: DC
  Administered 2013-02-05: 50 mg via ORAL
  Filled 2013-02-05 (×3): qty 1

## 2013-02-05 MED ORDER — CITALOPRAM HYDROBROMIDE 20 MG PO TABS
20.0000 mg | ORAL_TABLET | Freq: Every day | ORAL | Status: DC
  Administered 2013-02-06 – 2013-02-08 (×3): 20 mg via ORAL
  Filled 2013-02-05 (×4): qty 1
  Filled 2013-02-05: qty 3

## 2013-02-05 MED ORDER — ACETAMINOPHEN 325 MG PO TABS: 650.0000 mg | ORAL_TABLET | Freq: Four times a day (QID) | ORAL | Status: DC | PRN

## 2013-02-05 MED ORDER — ONDANSETRON 4 MG PO TBDP: 4.0000 mg | ORAL_TABLET | Freq: Four times a day (QID) | ORAL | Status: DC | PRN

## 2013-02-05 MED ORDER — CLONAZEPAM 0.5 MG PO TABS
0.5000 mg | ORAL_TABLET | Freq: Two times a day (BID) | ORAL | Status: DC
  Administered 2013-02-05: 0.5 mg via ORAL
  Filled 2013-02-05 (×2): qty 1

## 2013-02-05 MED ORDER — CHLORDIAZEPOXIDE HCL 25 MG PO CAPS: 25.0000 mg | ORAL_CAPSULE | ORAL | Status: DC

## 2013-02-05 MED ORDER — CHLORDIAZEPOXIDE HCL 25 MG PO CAPS: 25.0000 mg | ORAL_CAPSULE | Freq: Four times a day (QID) | ORAL | Status: DC | PRN

## 2013-02-05 MED ORDER — CHLORDIAZEPOXIDE HCL 25 MG PO CAPS: 25.0000 mg | ORAL_CAPSULE | Freq: Three times a day (TID) | ORAL | Status: DC

## 2013-02-05 MED ORDER — VITAMIN B-1 100 MG PO TABS
100.0000 mg | ORAL_TABLET | Freq: Every day | ORAL | Status: DC
  Administered 2013-02-06: 100 mg via ORAL
  Filled 2013-02-05 (×2): qty 1

## 2013-02-05 MED ORDER — HYDROXYZINE HCL 25 MG PO TABS: 25.0000 mg | ORAL_TABLET | Freq: Four times a day (QID) | ORAL | Status: DC | PRN
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
## 2013-02-05 NOTE — Tx Team (Signed)
Initial Interdisciplinary Treatment Plan  PATIENT STRENGTHS: (choose at least two) Ability for insight Active sense of humor Average or above average intelligence Capable of independent living Communication skills Financial means General fund of knowledge Motivation for treatment/growth Physical Health Work skills  PATIENT STRESSORS: Loss of grandmother Marital or family conflict   PROBLEM LIST: Problem List/Patient Goals Date to be addressed Date deferred Reason deferred Estimated date of resolution  "depression" 02/05/13     "anxiety" 02/05/13     "suicidal thoughts" 02/05/13                                          DISCHARGE CRITERIA:  Ability to meet basic life and health needs Improved stabilization in mood, thinking, and/or behavior Reduction of life-threatening or endangering symptoms to within safe limits  PRELIMINARY DISCHARGE PLAN: Outpatient therapy Return to previous living arrangement  PATIENT/FAMIILY INVOLVEMENT: This treatment plan has been presented to and reviewed with the patient, Sandra Myers, and/or family member, .  The patient and family have been given the opportunity to ask questions and make suggestions.  Sandra Myers [facility] 02/05/2013, 10:06 PM

## 2013-02-06 DIAGNOSIS — F332 Major depressive disorder, recurrent severe without psychotic features: Principal | ICD-10-CM

## 2013-02-06 LAB — URINALYSIS, ROUTINE W REFLEX MICROSCOPIC
Ketones, ur: NEGATIVE mg/dL
Nitrite: NEGATIVE
Specific Gravity, Urine: 1.022 (ref 1.005–1.030)
Urobilinogen, UA: 0.2 mg/dL (ref 0.0–1.0)
pH: 7 (ref 5.0–8.0)

## 2013-02-06 LAB — COMPREHENSIVE METABOLIC PANEL
BUN: 16 mg/dL (ref 6–23)
Calcium: 9.6 mg/dL (ref 8.4–10.5)
GFR calc Af Amer: 75 mL/min — ABNORMAL LOW (ref >90)
GFR calc non Af Amer: 65 mL/min — ABNORMAL LOW (ref >90)
Glucose, Bld: 92 mg/dL (ref 70–99)
Total Protein: 7.1 g/dL (ref 6.0–8.3)

## 2013-02-06 LAB — CBC WITH DIFFERENTIAL/PLATELET
Basophils Relative: 0 % (ref 0–1)
Eosinophils Absolute: 0.2 10*3/uL (ref 0.0–0.7)
HCT: 41.3 % (ref 36.0–46.0)
Hemoglobin: 13.9 g/dL (ref 12.0–15.0)
Lymphs Abs: 2.9 10*3/uL (ref 0.7–4.0)
MCH: 29.8 pg (ref 26.0–34.0)
MCHC: 33.7 g/dL (ref 30.0–36.0)
MCV: 88.4 fL (ref 78.0–100.0)
Monocytes Absolute: 0.9 10*3/uL (ref 0.1–1.0)
Neutro Abs: 5.7 10*3/uL (ref 1.7–7.7)

## 2013-02-06 LAB — URINE MICROSCOPIC-ADD ON

## 2013-02-06 MED ORDER — CLONAZEPAM 0.5 MG PO TABS
0.2500 mg | ORAL_TABLET | ORAL | Status: DC
  Administered 2013-02-06 – 2013-02-08 (×3): 0.25 mg via ORAL
  Filled 2013-02-06 (×3): qty 1

## 2013-02-06 MED ORDER — TRAZODONE HCL 50 MG PO TABS
50.0000 mg | ORAL_TABLET | Freq: Every day | ORAL | Status: DC
  Administered 2013-02-06 – 2013-02-07 (×2): 50 mg via ORAL
  Filled 2013-02-06: qty 3
  Filled 2013-02-06 (×3): qty 1

## 2013-02-06 MED ORDER — CLONAZEPAM 0.5 MG PO TABS
0.5000 mg | ORAL_TABLET | Freq: Every day | ORAL | Status: DC
  Administered 2013-02-06 – 2013-02-07 (×2): 0.5 mg via ORAL
  Filled 2013-02-06 (×2): qty 1

## 2013-02-06 NOTE — Progress Notes (Signed)
Recreation Therapy Notes  Date: 08.06.2014 Time: 3:00pm Location: [address] Dayroom  Group Topic: Communication, Team Building, Problem Solving  Goal Area(s) Addresses:  Patient will effectively work in groups to accomplish shared goal. Patient will verbalize skills used in to make activity successful. Patient will verbalize benefit of using skills in a healthy way. Patient will relate skills used during group session to personal development.   Behavioral Response: Engaged, Appropriate, Supportive, Encouraging   Intervention: Team Activity  Activity: Landing Pad. Patients were divided into two groups. Patients were given 12 drinking straws and a length of masking tape. Using supplies provided patients were asked to build a freestanding structure to catch a plastic golf ball dropped from approximately 4 feet.   Education: Customer service manager, Discharge Planning, Relapse Prevention  Education Outcome: Acknowledges Understanding.   Clinical Observations/Feedback: Patient actively participated in group session. Patient did not spontaneously contribute to opening discussion, however she appeared to actively listen, as she nodded in agreement with definition of personal development created by peers, additionally patient maintained appropriate eye contact with LRT and peers during this time. Patient actively participated in group activity, working well with peers in group, offering suggestions and assisting with building of group landing pad. Patient group was ultimately successful at building a structure to catch the plastic golf ball, upon being successful patient team congratulated each other with bright smiles and high fives. Patient actively contributed to wrap up discussion identifying listening skills ask a skill necessary to make activity successful, LRT assisted patient with redefining listening skills, as healthy communication skills. Patient was receptive to and in agreement with redefining  listening skills. Patient related communication skills to growing your support system. Patient explained that growing your support system helps you have multiple people to rely on when you need someone. Patient additionally related growing your support system to being able to develop as a person.    Marykay Lex Scout Gumbs, LRT/CTRS  Jearl Klinefelter 02/06/2013 4:13 PM

## 2013-02-06 NOTE — BHH Group Notes (Signed)
BHH Group Notes:  (Nursing/MHT/Case Management/Adjunct)  Date:  02/06/2013  Time:  11:04 AM  Type of Therapy:  Nurse Education  Participation Level:  Minimal  Participation Quality:  Appropriate and Attentive  Affect:  Flat  Cognitive:  Alert and Appropriate  Insight:  Appropriate  Engagement in Group:  Lacking  Modes of Intervention:  Education  Summary of Progress/Problems:  Sandra Myers 02/06/2013, 11:04 AM

## 2013-02-06 NOTE — BHH Group Notes (Signed)
[facility] LCSW Aftercare Discharge Planning Group Note   02/06/2013 8:45 AM  Participation Quality:  Alert and Appropriate   Mood/Affect:  Appropriate, Flat and Depressed  Depression Rating:  8  Anxiety Rating:  8  Thoughts of Suicide:  Pt denies SI/HI  Will you contract for safety?   Yes  Current AVH: Pt denies  Plan for Discharge/Comments:  Pt attended discharge planning group and actively participated in group.  CSW provided pt with today's workbook.  Pt reports coming to the hospital for depression, anxiety and SI.  Pt names current stressors as separation, recent break up and job, as being stressful.  Pt states that she lives in [place] and can return home.  Pt wants to return to [facility] and [facility] for medication management.  CSW will secure pt's follow up.  No further needs voiced by pt at this time.     Transportation Means: Pt reports access to transportation  Supports: Pt names friends as supportive  Sandra Myers, Sandra Myers 02/06/2013 9:36 AM
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
## 2013-02-06 NOTE — Progress Notes (Signed)
D- Patient is calm and appropriate.  She is acclimating to the unit routine and attending groups.  Rates hopelessness and depression at 7.  "Off and on" SI and contracts for safety.  Medication adjustment requested.  Reports low energy and poor ability to pay attention.  A- Support and encouragement given.  Continue current POC and evaluation of treatment goals.  R- Safety maintained.
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
## 2013-02-06 NOTE — BHH Suicide Risk Assessment (Signed)
[facility] Adult Inpatient Family/Significant Other Suicide Prevention Education  Suicide Prevention Education:   Patient Refusal for Family/Significant Other Suicide Prevention Education: The patient has refused to provide written consent for family/significant other to be provided Family/Significant Other Suicide Prevention Education during admission and/or prior to discharge.  Physician notified.  CSW provided suicide prevention information with patient.    The suicide prevention education provided includes the following:  Suicide risk factors  Suicide prevention and interventions  National Suicide Hotline telephone number  [facility] assessment telephone number  [facility] Emergency Assistance 911  [facility] and/or Residential Mobile Crisis Unit telephone number   Sandra Myers, [place] 02/06/2013 9:30 AM

## 2013-02-06 NOTE — [facility] Group Notes (Signed)
[facility] LCSW Group Therapy  Emotional Regulation 1:15 - 2: 30 PM        02/06/2013   Type of Therapy:  Group Therapy  Participation Level:  Appropriate  Participation Quality:  Appropriate  Affect:  Appropriate  Cognitive:  Attentive Appropriate  Insight:  Engaged  Engagement in Therapy:  Engaged  Modes of Intervention:  Discussion Exploration Problem-Solving Supportive  Summary of Progress/Problems:  Group topic was emotional regulations.  Patient participated in the discussion and was able to identify guilt and sadness as emotions that needed to regulated.  Patient was able to identify approprite coping skills.  Sandra Myers 02/06/2013

## 2013-02-06 NOTE — Tx Team (Signed)
Interdisciplinary Treatment Plan Update (Adult)  Date: 02/06/2013  Time Reviewed:  9:45 AM  Progress in Treatment: Attending groups: Yes Participating in groups:  Yes Taking medication as prescribed:  Yes Tolerating medication:  Yes Family/Significant othe contact made: CSW assessing  Patient understands diagnosis:  Yes Discussing patient identified problems/goals with staff:  Yes Medical problems stabilized or resolved:  Yes Denies suicidal/homicidal ideation: Yes Issues/concerns per patient self-inventory:  Yes Other:  New problem(s) identified: N/A  Discharge Plan or Barriers: CSW assessing for appropriate referrals. Pt will return to IOP and Carolynn Sayers for medication management and therapy.    Reason for Continuation of Hospitalization: Anxiety Depression Medication Stabilization  Comments: N/A  Estimated length of stay: 3-5 days  For review of initial/current patient goals, please see plan of care.  Attendees: Patient:     Family:     Physician:  Dr. Javier Glazier 02/06/2013 10:04 AM   Nursing:   Burnetta Sabin, RN 02/06/2013 10:04 AM   Clinical Social Worker:  Reyes Ivan, LCSWA 02/06/2013 10:04 AM   Other: Verne Spurr, PA 02/06/2013 10:04 AM   Other:  Lowella Grip, RN 02/06/2013 10:04 AM   Other:  Juline Patch, LCSW 02/06/2013 10:04 AM   Other:     Other:    Other:    Other:    Other:    Other:    Other:     Scribe for Treatment Team:   Carmina Miller, 02/06/2013 10:04 AM

## 2013-02-06 NOTE — Progress Notes (Signed)
Adult Psychoeducational Group Note  Date:  02/06/2013 Time:  6:33 PM  Group Topic/Focus:  Personal Choices and Values:   The focus of this group is to help patients assess and explore the importance of values in their lives, how their values affect their decisions, how they express their values and what opposes their expression.  Participation Level:  Active  Participation Quality:  Appropriate, Attentive and Sharing  Affect:  Appropriate  Cognitive:  Appropriate  Insight: Appropriate and Good  Engagement in Group:  Engaged  Modes of Intervention:  Discussion, Education, Socialization and Support  Additional Comments:  Heidy attended and participated during group. Patient stated negative and positive values and choices that patient have been made throughout life span. Patient completed identifying values and value oriented life worksheets during group shared how to improve values and explained values that are important to patient.   Karleen Hampshire Brittini 02/06/2013, 6:33 PM

## 2013-02-06 NOTE — Progress Notes (Addendum)
NUTRITION ASSESSMENT  Pt identified as at risk on the Malnutrition Screen Tool  INTERVENTION: 1. Educated patient on the importance of nutrition and encouraged intake of food and beverages. 2. Discussed weight goals.  NUTRITION DIAGNOSIS: Predicted suboptimal intake related to decreased appetite AEB patient report. Goal: Pt to meet >/= 90% of their estimated nutrition needs.  Monitor:  PO intake  Assessment:  Patient admitted with depression and SI.  States that she has had a very poor appetite and was not eating well until she went on a diet recently.  Ate fruit for breakfast and dinner, as much as she wanted, which was a small amount due to poor appetite.  Would eat a double protein at lunch.  Mostly a salad with chicken and beef stew.  Patient reports eating better since initiating this plan and has lost 5 lbs as well.    Discussed importance of adequate protein intake.  Patient verbalized.  Healthy eating and nutrition discussed and provided patient with handout "My Plate. My Choice. My Health."  32 y.o. female  Height: Ht Readings from Last 1 Encounters:  02/05/13 5\' 7"  (1.702 m)    Weight: Wt Readings from Last 1 Encounters:  02/05/13 215 lb (97.523 kg)    Weight Hx: Wt Readings from Last 10 Encounters:  02/05/13 215 lb (97.523 kg)  01/16/13 217 lb 6.4 oz (98.612 kg)  01/12/13 210 lb (95.255 kg)    BMI:  Body mass index is 33.67 kg/(m^2). Pt meets criteria for obesity grade 1 based on current BMI.  Estimated Nutritional Needs: Kcal: 25-30 kcal/kg Protein: > 1 gram protein/kg Fluid: 1 ml/kcal  Diet Order: General Pt is also offered choice of unit snacks mid-morning and mid-afternoon.  Pt is eating as desired.   Lab results and medications reviewed.   Oran Rein, RD, LDN Clinical Inpatient Dietitian Pager:  [phone] Weekend and after hours pager:  [phone]

## 2013-02-06 NOTE — Progress Notes (Signed)
Adult Psychoeducational Group Note  Date:  02/06/2013 Time:  9:28 PM  Group Topic/Focus:  Wrap-Up Group:   The focus of this group is to help patients review their daily goal of treatment and discuss progress on daily workbooks.  Participation Level:  Active  Participation Quality:  Appropriate  Affect:  Appropriate  Cognitive:  Alert  Insight: Appropriate  Engagement in Group:  Engaged  Modes of Intervention:  Discussion  Additional Comments:  Pt stated that her day did not start out well but has gotten better throughout the day. Pt also stated that she needs to learn how to deal with her feelings instead of pushing them away, and also work on changing her mind set.  Kaleen Odea R 02/06/2013, 9:28 PM

## 2013-02-07 DIAGNOSIS — F329 Major depressive disorder, single episode, unspecified: Secondary | ICD-10-CM

## 2013-02-07 LAB — RAPID URINE DRUG SCREEN, HOSP PERFORMED: Benzodiazepines: NOT DETECTED

## 2013-02-07 LAB — PREGNANCY, URINE: Preg Test, Ur: NEGATIVE

## 2013-02-07 NOTE — Progress Notes (Signed)
Adult Psychoeducational Group Note  Date:  02/07/2013 Time:  11:33 AM  Group Topic/Focus:  Self Esteem Action Plan:   The focus of this group is to help patients create a plan to continue to build self-esteem after discharge.  Participation Level:  Active  Participation Quality:  Appropriate and Attentive  Affect:  Appropriate  Cognitive:  Alert and Appropriate  Insight: Appropriate  Engagement in Group:  Engaged  Modes of Intervention:  Discussion  Additional Comments:  Pt. Was attentive and appropriate during today's group discussion. Pt. Completed the Self Esteem ABC's. Pt. Stated that she need to work on communicating to support system when she has issues and and need to talk. Pt. Came up with the following words for self esteem Zealous, Wisdom, and Honest.   Sandra Myers, Glee Arvin D 02/07/2013, 11:33 AM

## 2013-02-07 NOTE — Progress Notes (Signed)
D: Patient's self inventory sheet, patient has fair sleep, improving appetite, normal energy level, improving attention span.  Rated depression #8, hopelessness #9.  Denied withdrawals.  SI off/on, contracts for safety.  Has felt lightheaded in past 24 hours. Zero pain goal.  Worst pain #1.  After discharge, plans to "set time aside to do activities alone, exercise, hiking, woodworking, crafts, etc.  No problems taking meds after discharge. A:  Medications administered per MD orders.  Emotional support and encouragement given patient. R:  Denied SI and HI.   Denied A/V hallucinations.  Denied pain.  Will continue to monitor patient for safety with 15 minute checks.  Safety maintained.

## 2013-02-07 NOTE — [facility] Group Notes (Signed)
[facility] LCSW Group Therapy  [employer] of  1:15 - 2:30 PM  02/07/2013   Type of Therapy:  Group Therapy  Participation Level:  Minimal  Participation Quality:  Attentive  Affect:  Appropriate  Cognitive:  Appropriate  Insight:  Developing/Improving and Engaged  Engagement in Therapy:  Developing/Improving Engaged  Modes of Intervention:  Discussion, Education, Exploration, Problem-Solving, Rapport Building, Support   Summary of Progress/Problems:  Patient listened attentively to speaker from [employer]. Patient commented on stigma held by society regarding mental health diagnoses.  Sandra Myers 02/07/2013

## 2013-02-07 NOTE — Progress Notes (Signed)
D   Pt is pleasant and cooperative   She attends and participates in groups and interacts well with others   She denies suicidal ideation at present but does agree to contract for safety if starting to feel that way  A   Verbal support given   Medications administered and effectiveness monitored   Q 15 min checks R   Pt safe at present
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
## 2013-02-07 NOTE — Progress Notes (Signed)
The focus of this group is to educate the patient on the purpose and policies of crisis stabilization and provide a format to answer questions about their admission.  The group details unit policies and expectations of patients while admitted.  Patient attended 0900 orientation nurse education group this morning.  Patient actively participated, appropriate affect, alert, appropriate insight, appropriately engaged.  Stated "laughter makes life easier".

## 2013-02-07 NOTE — Progress Notes (Signed)
[facility] MD Progress Note  02/07/2013 4:25 PM Sandra Myers  MRN:  [id number] Subjective:  My anxiety is worse today, as a friend has given her bad news. She notes that she slept OK, appetite is normal, and her depression is moderate. Diagnosis:  MDD  ADL's:  Intact  Sleep: Good  Appetite:  Good  Suicidal Ideation:  Yes with plan, intent is decreasing Homicidal Ideation:  denies AEB (as evidenced by):  Psychiatric Specialty Exam: Review of Systems  Constitutional: Negative.  Negative for fever, chills, weight loss, malaise/fatigue and diaphoresis.  HENT: Negative for congestion and sore throat.   Eyes: Negative for blurred vision, double vision and photophobia.  Respiratory: Negative for cough, shortness of breath and wheezing.   Cardiovascular: Negative for chest pain, palpitations and PND.  Gastrointestinal: Negative for heartburn, nausea, vomiting, abdominal pain, diarrhea and constipation.  Musculoskeletal: Negative for myalgias, joint pain and falls.  Neurological: Negative for dizziness, tingling, tremors, sensory change, speech change, focal weakness, seizures, loss of consciousness, weakness and headaches.  Endo/Heme/Allergies: Negative for polydipsia. Does not bruise/bleed easily.  Psychiatric/Behavioral: Negative for depression, suicidal ideas, hallucinations, memory loss and substance abuse. The patient is not nervous/anxious and does not have insomnia.     Blood pressure 115/78, pulse 92, temperature 98.8 F (37.1 C), temperature source Oral, resp. rate 16, height 5\' 7"  (1.702 m), weight 97.523 kg (215 lb), last menstrual period 01/21/2013.Body mass index is 33.67 kg/(m^2).  General Appearance: Casual  Eye Contact::  Good  Speech:  Clear and Coherent  Volume:  Normal  Mood:  Anxious and Depressed  Affect:  Congruent  Thought Process:  Goal Directed  Orientation:  Full (Time, Place, and Person)  Thought Content:  WDL  Suicidal Thoughts:  Yes.  with intent/plan   Homicidal Thoughts:  No  Memory:  NA  Judgement:  Impaired  Insight:  Lacking  Psychomotor Activity:  Normal  Concentration:  Fair  Recall:  Fair  Akathisia:  No  Handed:  Right  AIMS (if indicated):     Assets:  Communication Skills Desire for Improvement Financial Resources/Insurance Housing Physical Health Social Support  Sleep:  Number of Hours: 6   Current Medications: Current Facility-Administered Medications  Medication Dose Route Frequency Provider Last Rate Last Dose  . acetaminophen (TYLENOL) tablet 650 mg  650 mg Oral Q6H PRN Larena Sox, MD      . alum & mag hydroxide-simeth (MAALOX/MYLANTA) 200-200-20 MG/5ML suspension 30 mL  30 mL Oral Q4H PRN Larena Sox, MD      . citalopram (CELEXA) tablet 20 mg  20 mg Oral Daily Kerry Hough, PA-C   20 mg at 02/07/13 0755  . clonazePAM (KLONOPIN) tablet 0.25 mg  0.25 mg Oral BH-q7a Verne Spurr, PA-C   0.25 mg at 02/07/13 4098  . clonazePAM (KLONOPIN) tablet 0.5 mg  0.5 mg Oral QHS Verne Spurr, PA-C   0.5 mg at 02/06/13 2156  . magnesium hydroxide (MILK OF MAGNESIA) suspension 30 mL  30 mL Oral Daily PRN Larena Sox, MD      . traZODone (DESYREL) tablet 50 mg  50 mg Oral QHS Verne Spurr, PA-C   50 mg at 02/06/13 2155    Lab Results:  Results for orders placed during the hospital encounter of 02/05/13 (from the past 48 hour(s))  URINALYSIS, ROUTINE W REFLEX MICROSCOPIC     Status: Abnormal   Collection Time    02/06/13  5:18 PM      Result Value Range  Color, Urine YELLOW  YELLOW   APPearance CLEAR  CLEAR   Specific Gravity, Urine 1.022  1.005 - 1.030   pH 7.0  5.0 - 8.0   Glucose, UA NEGATIVE  NEGATIVE mg/dL   Hgb urine dipstick NEGATIVE  NEGATIVE   Bilirubin Urine NEGATIVE  NEGATIVE   Ketones, ur NEGATIVE  NEGATIVE mg/dL   Protein, ur NEGATIVE  NEGATIVE mg/dL   Urobilinogen, UA 0.2  0.0 - 1.0 mg/dL   Nitrite NEGATIVE  NEGATIVE   Leukocytes, UA TRACE (*) NEGATIVE   Comment: Performed at  [facility]  URINE MICROSCOPIC-ADD ON     Status: Abnormal   Collection Time    02/06/13  5:18 PM      Result Value Range   Squamous Epithelial / LPF FEW (*) RARE   WBC, UA 0-2  <3 WBC/hpf   Comment: Performed at [facility]  CBC WITH DIFFERENTIAL     Status: None   Collection Time    02/06/13  8:00 PM      Result Value Range   WBC 9.7  4.0 - 10.5 K/uL   RBC 4.67  3.87 - 5.11 MIL/uL   Hemoglobin 13.9  12.0 - 15.0 g/dL   HCT 16.1  09.6 - 04.5 %   MCV 88.4  78.0 - 100.0 fL   MCH 29.8  26.0 - 34.0 pg   MCHC 33.7  30.0 - 36.0 g/dL   RDW 40.9  81.1 - 91.4 %   Platelets 240  150 - 400 K/uL   Neutrophils Relative % 59  43 - 77 %   Lymphocytes Relative 30  12 - 46 %   Monocytes Relative 9  3 - 12 %   Eosinophils Relative 2  0 - 5 %   Basophils Relative 0  0 - 1 %   Neutro Abs 5.7  1.7 - 7.7 K/uL   Lymphs Abs 2.9  0.7 - 4.0 K/uL   Monocytes Absolute 0.9  0.1 - 1.0 K/uL   Eosinophils Absolute 0.2  0.0 - 0.7 K/uL   Basophils Absolute 0.0  0.0 - 0.1 K/uL   WBC Morphology ATYPICAL LYMPHOCYTES     Comment: Performed at [facility]  COMPREHENSIVE METABOLIC PANEL     Status: Abnormal   Collection Time    02/06/13  8:00 PM      Result Value Range   Sodium 134 (*) 135 - 145 mEq/L   Potassium 4.3  3.5 - 5.1 mEq/L   Chloride 98  96 - 112 mEq/L   CO2 26  19 - 32 mEq/L   Glucose, Bld 92  70 - 99 mg/dL   BUN 16  6 - 23 mg/dL   Creatinine, Ser 7.82  0.50 - 1.10 mg/dL   Calcium 9.6  8.4 - 95.6 mg/dL   Total Protein 7.1  6.0 - 8.3 g/dL   Albumin 3.8  3.5 - 5.2 g/dL   AST 19  0 - 37 U/L   ALT 28  0 - 35 U/L   Alkaline Phosphatase 36 (*) 39 - 117 U/L   Total Bilirubin 0.2 (*) 0.3 - 1.2 mg/dL   GFR calc non Af Amer 65 (*) >90 mL/min   GFR calc Af Amer 75 (*) >90 mL/min   Comment:            The eGFR has been calculated     using the CKD EPI equation.     This calculation has  not been     validated in all clinical     situations.      eGFR's persistently     <90 mL/min signify     possible Chronic Kidney Disease.     Performed at [facility]    Physical Findings: AIMS: Facial and Oral Movements Muscles of Facial Expression: None, normal Lips and Perioral Area: None, normal Jaw: None, normal Tongue: None, normal,Extremity Movements Upper (arms, wrists, hands, fingers): None, normal Lower (legs, knees, ankles, toes): None, normal, Trunk Movements Neck, shoulders, hips: None, normal, Overall Severity Severity of abnormal movements (highest score from questions above): None, normal Incapacitation due to abnormal movements: None, normal Patient's awareness of abnormal movements (rate only patient's report): No Awareness, Dental Status Current problems with teeth and/or dentures?: No Does patient usually wear dentures?: No  CIWA:    COWS:     Treatment Plan Summary: Daily contact with patient to assess and evaluate symptoms and progress in treatment Medication management  Plan:1. Continue crisis management and stabilization. 2. Medication management to reduce current symptoms to base line and improve patient's overall level of functioning 3. Treat health problems as indicated. 4. Develop treatment plan to decrease risk of relapse upon discharge and the need for     readmission. 5. Psycho-social education regarding relapse prevention and self care. 6. Health care follow up as needed for medical problems. 7. Continue home medications where appropriate.    Medical Decision Making Problem Points:  Established problem, stable/improving (1) and Review of last therapy session (1) Data Points:  Review or order medicine tests (1)  I certify that inpatient services furnished can reasonably be expected to improve the patient's condition.  Rona Ravens. [place] RPAC 4:28 PM 02/07/2013  Reviewed the information documented and agree with the treatment plan.  Kaya Klausing,JANARDHAHA R. 02/07/2013 6:34 PM
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
## 2013-02-08 DIAGNOSIS — F411 Generalized anxiety disorder: Secondary | ICD-10-CM

## 2013-02-08 MED ORDER — CITALOPRAM HYDROBROMIDE 20 MG PO TABS: 20.0000 mg | ORAL_TABLET | Freq: Every day | ORAL | Status: DC

## 2013-02-08 MED ORDER — CLONAZEPAM 0.5 MG PO TABS: ORAL_TABLET | ORAL | Status: DC

## 2013-02-08 MED ORDER — TRAZODONE HCL 50 MG PO TABS: 50.0000 mg | ORAL_TABLET | Freq: Every day | ORAL | Status: DC

## 2013-02-08 NOTE — Progress Notes (Signed)
[facility] Adult Case Management Discharge Plan :  Will you be returning to the same living situation after discharge: Yes,  Patient is returning to her home. At discharge, do you have transportation home?:Yes,  Patient has transportation. Do you have the ability to pay for your medications:Yes,  Patient able to afford medications.  Release of information consent forms completed and in the chart;  Patient's signature needed at discharge.  Patient to Follow up at: Follow-up Information   Follow up with [facility] On 02/11/2013. (Resume Intensive Outpatient Program.  Groups are Monday - Friday 9 am - 12 pm)    Contact information:   [address] [place], [place] [address] Phone: [phone]      Follow up with [facility] On 02/26/2013. (Appointment scheduled at 5:00 pm with Carolynn Sayers, NP for medication management)    Contact information:   [address] [place], [place] [address] Phone: [phone] Fax: [phone]      Patient denies SI/HI: Patient no longer endorsing SI/HI or other thoughts of self harm.   Safety Planning and Suicide Prevention discussed:  .Reviewed with all patients during discharge planning group  Zaul Hubers, [place] July 02/08/2013, 10:26 AM
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
## 2013-02-08 NOTE — Progress Notes (Signed)
Adult Psychoeducational Group Note  Date:  02/08/2013 Time:  11:00AM Group Topic/Focus:  Relapse Prevention Planning:   The focus of this group is to define relapse and discuss the need for planning to combat relapse.  Participation Level:  Active  Participation Quality:  Appropriate and Attentive  Affect:  Appropriate  Cognitive:  Alert and Appropriate  Insight: Appropriate  Engagement in Group:  Engaged  Modes of Intervention:  Discussion  Additional Comments:  Pt. Was attentive and appropriate during today's group discussion. Pt was able to discuss Relapse Prevention. Pt stated that relapse mean to her not talking to her support group and letting them know what is going on with her. Pt stated that she is going to work on communicating and taking her medication daily.   Bing Plume D 02/08/2013, 12:00 PM

## 2013-02-08 NOTE — Progress Notes (Signed)
[facility] Group Notes:  (Nursing/MHT/Case Management/Adjunct)  Date:  02/08/2013  Time:  10:32 AM  Type of Therapy:  Therapeutic Activity  Participation Level:  Active  Participation Quality:  Appropriate and Sharing  Affect:  Appropriate  Cognitive:  Appropriate  Insight:  Appropriate  Engagement in Group:  Engaged and Supportive  Modes of Intervention:  Activity and Socialization  Summary of Progress/Problems:  Caswell Corwin 02/08/2013, 10:32 AM
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
## 2013-02-08 NOTE — Tx Team (Signed)
Interdisciplinary Treatment Plan Update   Date Reviewed:  02/08/2013  Time Reviewed:  9:40 AM  Progress in Treatment:   Attending groups: Yes Participating in groups: Yes Taking medication as prescribed: Yes  Tolerating medication: Yes Family/Significant other contact made: Yes  Patient understands diagnosis: Yes  Discussing patient identified problems/goals with staff: Yes Medical problems stabilized or resolved: Yes Denies suicidal/homicidal ideation: Yes Patient has not harmed self or others: Yes  For review of initial/current patient goals, please see plan of care.  Estimated Length of Stay:  Discharge home today.  Reasons for Continued Hospitalization:   New Problems/Goals identified:    Discharge Plan or Barriers:   Home with outpatient follow up MH-IOP  Additional Comments:  Discharge today  Attendees:  Patient: Sandra Myers 02/08/2013 9:40 AM   Signature: Mervyn Gay, MD 02/08/2013 9:40 AM  Signature:  Verne Spurr, PA 02/08/2013 9:40 AM  Signature: Harold Barban, RN 02/08/2013 9:40 AM  Signature: 02/08/2013 9:40 AM  Signature:  02/08/2013 9:40 AM  Signature:  Juline Patch, LCSW 02/08/2013 9:40 AM  Signature:  Onnie Boer, RN, [facility] 02/08/2013 9:40 AM  Signature:  Maseta Dorley,Care Coordinator 02/08/2013 9:40 AM  Signature: 02/08/2013 9:40 AM  Signature:    Signature:    Signature:      Scribe for Treatment Team:   Juline Patch,  02/08/2013 9:40 AM

## 2013-02-11 ENCOUNTER — Other Ambulatory Visit (HOSPITAL_COMMUNITY): Payer: 59 | Attending: Physician Assistant | Admitting: Psychiatry

## 2013-02-11 ENCOUNTER — Encounter (HOSPITAL_COMMUNITY): Payer: Self-pay

## 2013-02-11 DIAGNOSIS — F4310 Post-traumatic stress disorder, unspecified: Secondary | ICD-10-CM | POA: Insufficient documentation

## 2013-02-11 DIAGNOSIS — F332 Major depressive disorder, recurrent severe without psychotic features: Secondary | ICD-10-CM | POA: Insufficient documentation

## 2013-02-11 DIAGNOSIS — F411 Generalized anxiety disorder: Secondary | ICD-10-CM

## 2013-02-11 DIAGNOSIS — F331 Major depressive disorder, recurrent, moderate: Secondary | ICD-10-CM
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
## 2013-02-12 ENCOUNTER — Encounter (HOSPITAL_COMMUNITY): Payer: 59 | Attending: Psychiatry | Admitting: Psychiatry

## 2013-02-12 DIAGNOSIS — F329 Major depressive disorder, single episode, unspecified: Secondary | ICD-10-CM

## 2013-02-12 DIAGNOSIS — F4310 Post-traumatic stress disorder, unspecified: Secondary | ICD-10-CM | POA: Insufficient documentation

## 2013-02-12 DIAGNOSIS — F332 Major depressive disorder, recurrent severe without psychotic features: Secondary | ICD-10-CM | POA: Insufficient documentation
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
## 2013-02-13 ENCOUNTER — Encounter (HOSPITAL_COMMUNITY): Payer: 59 | Admitting: Psychiatry

## 2013-02-13 DIAGNOSIS — F329 Major depressive disorder, single episode, unspecified: Secondary | ICD-10-CM
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
## 2013-02-13 NOTE — Progress Notes (Signed)
    Daily Group Progress Note  Program: IOP  Group Time: 9:00-10:30 am   Participation Level: Active  Behavioral Response: Resistant and Attention-Seeking  Type of Therapy:  Process Group  Summary of Progress: Pt was reluctant to share about her stressors and said her main goal for therapy is to learn how to "feel". Pt states she has never been able to feel emotions and would like to learn how. Pt said her employer sent her here following work duties falling below her normal level of functioning.      Group Time: 10:30 am - 12:00 pm   Participation Level:  Active  Behavioral Response: Appropriate  Type of Therapy: Psycho-education Group  Summary of Progress: Pt learned the DBT skill of Mindfulness and practiced the skills of observe, describe, participate  Aizen Duval E, LCSW

## 2013-02-13 NOTE — Progress Notes (Signed)
Patient Discharge Instructions:  After Visit Summary (AVS):   Faxed to:  02/13/13 Discharge Summary Note:   Faxed to:  02/13/13 Psychiatric Admission Assessment Note:   Faxed to:  02/13/13 Suicide Risk Assessment - Discharge Assessment:   Faxed to:  02/13/13 Faxed/Sent to the Next Level Care provider:  02/13/13 Next Level Care Provider Has Access to the EMR, 02/13/13 Faxed to [facility] @ [phone] Records provided to [facility] via [facility]/Epic access.  Jerelene Redden, 02/13/2013, 12:52 PM

## 2013-02-14 ENCOUNTER — Telehealth (HOSPITAL_COMMUNITY): Payer: Self-pay | Admitting: Psychiatry

## 2013-02-14 ENCOUNTER — Encounter (HOSPITAL_COMMUNITY): Payer: 59 | Admitting: Psychiatry

## 2013-02-14 DIAGNOSIS — F329 Major depressive disorder, single episode, unspecified: Secondary | ICD-10-CM

## 2013-02-14 NOTE — Progress Notes (Signed)
    Daily Group Progress Note  Program: IOP  Group Time: 9:00-10:30 am   Participation Level: Active  Behavioral Response: Passive-Aggressive  Type of Therapy:  Process Group  Summary of Progress: Pt is displaying patterns of nonaffective coping skills for managing difficult emotions. Pt has been observed engaging in "splitting" behavior and creates conflict among staff and group members. She became upset when she was structured away from engaging in complaining and made this her main form of communication. Pt was negative about the majority of discussions and was so upset with being deferred away from this behavior that she refused to participate in the closing wisdoms and walked out of the room in an aggressive manner.      Group Time: 10:30 am - 12:00 pm   Participation Level:  Active  Behavioral Response: Appropriate  Type of Therapy: Psycho-education Group  Summary of Progress: Pt learned about supportive programs through the mental health association.   Carman Ching, LCSW

## 2013-02-15 ENCOUNTER — Encounter (HOSPITAL_COMMUNITY): Payer: 59 | Admitting: Psychiatry

## 2013-02-15 DIAGNOSIS — F329 Major depressive disorder, single episode, unspecified: Secondary | ICD-10-CM

## 2013-02-15 NOTE — Progress Notes (Signed)
    Daily Group Progress Note  Program: IOP  Group Time: 9:00-10:30 am   Participation Level: Minimal  Behavioral Response: Passive-Aggressive  Type of Therapy:  Process Group  Summary of Progress: Pt appeared agitated and engaged in passive aggressive behavior to make her agitation known to the group. Pt refused to talk and ten minutes before break she left the group room.      Group Time: 10:30 am - 12:00 pm   Participation Level:  Minimal  Behavioral Response: Resistant  Type of Therapy: Psycho-education Group  Summary of Progress: Pt learned the DBT skill of distress tolerance using Self soothing to manage difficult emotions.   Carman Ching, LCSW

## 2013-02-15 NOTE — Patient Instructions (Addendum)
Patient will complete MH-IOP on 02-22-13.  Will follow up with Isaiah Serge, [facility] on 02-25-13 @ 1pm and Velta Addison, NP on 02-26-13 @ 5pm.  Encouraged support groups.  Tentatively return to work on 02-26-13.

## 2013-02-18 ENCOUNTER — Encounter (HOSPITAL_COMMUNITY): Payer: 59 | Admitting: Psychiatry

## 2013-02-18 DIAGNOSIS — F329 Major depressive disorder, single episode, unspecified: Secondary | ICD-10-CM

## 2013-02-18 NOTE — Progress Notes (Signed)
    Daily Group Progress Note  Program: IOP  Group Time: 9:00-10:30 am   Participation Level: Active  Behavioral Response: Passive-Aggressive  Type of Therapy:  Process Group  Summary of Progress: Pt is smiling, laughing, talkative, energetic and reports having a "great weekend". When asked about being ready to return to her job she described an intense anxiety and depression that she feels prevents her from working and said she knows she will need the full twelve weeks off that were offered to her. Pt is quick to engage in conflict, especially with the facilitator, and engages in splitting behaviors among the group members. Pt said she is aware she has destructive behaviors that cause negative consequences in her life, but she struggles to see how she is engaging in blaming others and other traits that are causing her distress in her life. She is being taught DBT skills to manage these ineffective tendencies.      Group Time: 10:30 am - 12:00 pm   Participation Level:  Active  Behavioral Response: Appropriate  Type of Therapy: Psycho-education Group  Summary of Progress: Pt participated in a group on grief and loss and identified current losses impacting wellness and appropriate coping skills to grieve the loss.   Carman Ching, LCSW

## 2013-02-19 ENCOUNTER — Encounter (HOSPITAL_COMMUNITY): Payer: 59 | Admitting: Psychiatry

## 2013-02-19 DIAGNOSIS — F329 Major depressive disorder, single episode, unspecified: Secondary | ICD-10-CM

## 2013-02-20 ENCOUNTER — Encounter (HOSPITAL_COMMUNITY): Payer: 59 | Admitting: Psychiatry

## 2013-02-20 DIAGNOSIS — F329 Major depressive disorder, single episode, unspecified: Secondary | ICD-10-CM

## 2013-02-20 NOTE — Progress Notes (Signed)
    Daily Group Progress Note  Program: IOP  Group Time: 9:00-10:30 am   Participation Level: Active  Behavioral Response: Passive-Aggressive  Type of Therapy:  Process Group  Summary of Progress: Pt is engaging in splitting behaviors and is talking to members outside of the group and saying that writer has been saying negative things about the other Pts. Pt lacks insight into her maladaptive behaviors that she is using to prevent herself from focusing on her own issues. Pt stated she was "the happiest she has been in a long time" and was smiling and laughing as the group tried to recover from he chaos she caused. Pt is learning DBT skills to challenge traits Borderline Personality Disorder.      Group Time: 10:30 am - 12:00 pm   Participation Level:  Active  Behavioral Response: Appropriate  Type of Therapy: Psycho-education Group  Summary of Progress: Pt participated in a goodbye ceremony to another member and practiced the skill of healthy closure.   Carman Ching, LCSW

## 2013-02-20 NOTE — Progress Notes (Signed)
    Daily Group Progress Note  Program: IOP  Group Time: 9:00-10:30 am   Participation Level: Active  Behavioral Response: Passive-Aggressive  Type of Therapy:  Process Group  Summary of Progress: Pt learned the DBT skill of distress tolerance and how to apply to manage difficult emotions.      Group Time: 10:30 am - 12:00 pm   Participation Level:  Active  Behavioral Response: Appropriate  Type of Therapy: Psycho-education Group  Summary of Progress: Pt practiced the skill of Distress tolerance by participating in a Progressive Muscle Relaxation exercise and reported a reduction in stress symptoms.  Carman Ching, LCSW

## 2013-02-21 ENCOUNTER — Encounter (HOSPITAL_COMMUNITY): Payer: 59 | Admitting: Psychiatry

## 2013-02-21 DIAGNOSIS — F329 Major depressive disorder, single episode, unspecified: Secondary | ICD-10-CM

## 2013-02-21 NOTE — Progress Notes (Signed)
    Daily Group Progress Note  Program: IOP  Group Time: 9:00-10:30 am   Participation Level: Active  Behavioral Response: Appropriate  Type of Therapy:  Process Group  Summary of Progress: Pt is scheduled to end the group tomorrow and reports feeling good. She said the majority of her depression symptoms have subsided and she feels ready to consider returning to work in the near future.      Group Time: 10:30 am - 12:00 pm   Participation Level:  Active  Behavioral Response: Appropriate  Type of Therapy: Psycho-education Group  Summary of Progress: Pt learned about Depression as a medical condition and identified he nine symptoms and how to recognize them.   Carman Ching, LCSW

## 2013-02-22 ENCOUNTER — Encounter (HOSPITAL_COMMUNITY): Payer: 59 | Admitting: Psychiatry

## 2013-02-22 DIAGNOSIS — F329 Major depressive disorder, single episode, unspecified: Secondary | ICD-10-CM

## 2013-02-24 ENCOUNTER — Encounter (HOSPITAL_COMMUNITY): Payer: Self-pay | Admitting: Psychiatry
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
## 2013-02-25 ENCOUNTER — Encounter (HOSPITAL_COMMUNITY): Payer: 59
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
## 2013-02-25 NOTE — Patient Instructions (Signed)
Patient d/c'd on 02-22-13.  F/U with Isaiah Serge, LPC on 02-25-13 @ 1pm and Velta Addison, NP on 02-26-13 @ 5pm.  Encouraged support groups.
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
## 2013-02-26 ENCOUNTER — Encounter (HOSPITAL_COMMUNITY): Payer: 59

## 2013-02-27 ENCOUNTER — Encounter (HOSPITAL_COMMUNITY): Payer: 59

## 2013-02-28 ENCOUNTER — Encounter (HOSPITAL_COMMUNITY): Payer: 59

## 2013-03-01 ENCOUNTER — Encounter (HOSPITAL_COMMUNITY): Payer: 59

## 2013-03-05 ENCOUNTER — Encounter (HOSPITAL_COMMUNITY): Payer: 59

## 2013-03-06 ENCOUNTER — Encounter (HOSPITAL_COMMUNITY): Payer: 59

## 2013-03-06 ENCOUNTER — Ambulatory Visit (INDEPENDENT_AMBULATORY_CARE_PROVIDER_SITE_OTHER): Payer: 59 | Admitting: Emergency Medicine

## 2013-03-06 VITALS — BP 122/84 | HR 81 | Temp 98.8°F | Resp 16 | Ht 67.0 in | Wt 211.0 lb

## 2013-03-06 DIAGNOSIS — R51 Headache: Secondary | ICD-10-CM

## 2013-03-06 NOTE — Progress Notes (Signed)
[facility] [address], [place] [address] [phone]  Date:  03/06/2013   Name:  Sandra Myers   DOB:  03-31-1981   MRN:  [id number]  PCP:  Pcp Not In System    Chief Complaint: Headache and Nausea   History of Present Illness:  Sandra Myers is a 32 y.o. very pleasant female patient who presents with the following:  Patient underwent laser hair removal 10 days ago.  On the first day she had her armpits and bikini line treated and the next her lower legs.  The day of the procedure she developed a severe headache and nausea requiring her to go to bed in midafternoon on both days.  She has no history of migraine or other severe headaches and denies any antecedent illness or sick contacts.  The symptoms have completely resolved.  No improvement with over the counter medications or other home remedies. Denies other complaint or health concern today.   Patient Active Problem List   Diagnosis Date Noted  . Depression 02/13/2013    Past Medical History  Diagnosis Date  . Back pain   . Depression   . Anxiety     Past Surgical History  Procedure Laterality Date  . Facial reconstruction surgery  2012, 2013    3 surgeries  . Appendectomy  Age 32  . Tonsillectomy and adenoidectomy  Age 27    History  Substance Use Topics  . Smoking status: Former Smoker    Types: Cigarettes    Quit date: 01/16/2003  . Smokeless tobacco: Not on file  . Alcohol Use: No     Comment: OCCASIONALLY    Family History  Problem Relation Age of Onset  . Bipolar disorder Cousin   . Hyperlipidemia Mother   . Hypertension Mother   . Hypertension Maternal Grandmother   . Hyperlipidemia Maternal Grandfather   . Other Maternal Grandfather     carotoid stentosis    No Known Allergies  Medication list has been reviewed and updated.  Current Outpatient Prescriptions on File Prior to Visit  Medication Sig Dispense Refill  . citalopram (CELEXA) 20 MG tablet Take 1  tablet (20 mg total) by mouth daily.  30 tablet  1  . clonazePAM (KLONOPIN) 0.5 MG tablet Take 1/2 tablet (0.25mg ) each morning, and 1 tablet (0.5mg ) at hs.  30 tablet  0  . traZODone (DESYREL) 50 MG tablet Take 100 mg by mouth at bedtime.       No current facility-administered medications on file prior to visit.    Review of Systems:  As per HPI, otherwise negative.    Physical Examination: Filed Vitals:   03/06/13 0847  BP: 122/84  Pulse: 81  Temp: 98.8 F (37.1 C)  Resp: 16   Filed Vitals:   03/06/13 0847  Height: 5\' 7"  (1.702 m)  Weight: 211 lb (95.709 kg)   Body mass index is 33.04 kg/(m^2). Ideal Body Weight: Weight in (lb) to have BMI = 25: 159.3   GEN: WDWN, NAD, Non-toxic, Alert & Oriented x 3 HEENT: Atraumatic, Normocephalic.  Ears and Nose: No external deformity. EXTR: No clubbing/cyanosis/edema NEURO: Normal gait.  PSYCH: Normally interactive. Conversant. Not depressed or anxious appearing.  Calm demeanor.    Assessment and Plan: Headache  Signed,  Phillips Odor, MD

## 2013-03-06 NOTE — Patient Instructions (Addendum)
Avoid further laser treatments.

## 2013-03-07 ENCOUNTER — Encounter (HOSPITAL_COMMUNITY): Payer: 59

## 2013-03-08 ENCOUNTER — Encounter (HOSPITAL_COMMUNITY): Payer: 59

## 2013-03-11 ENCOUNTER — Encounter (HOSPITAL_COMMUNITY): Payer: 59

## 2013-05-15 ENCOUNTER — Encounter: Payer: Self-pay | Admitting: Physician Assistant

## 2013-05-15 ENCOUNTER — Ambulatory Visit (INDEPENDENT_AMBULATORY_CARE_PROVIDER_SITE_OTHER): Payer: 59 | Admitting: Physician Assistant

## 2013-05-15 VITALS — BP 126/82 | HR 91 | Temp 98.5°F | Resp 16 | Ht 66.75 in | Wt 212.2 lb

## 2013-05-15 DIAGNOSIS — F419 Anxiety disorder, unspecified: Secondary | ICD-10-CM

## 2013-05-15 DIAGNOSIS — R002 Palpitations: Secondary | ICD-10-CM

## 2013-05-15 DIAGNOSIS — R55 Syncope and collapse: Secondary | ICD-10-CM

## 2013-05-15 DIAGNOSIS — IMO0002 Reserved for concepts with insufficient information to code with codable children: Secondary | ICD-10-CM

## 2013-05-15 DIAGNOSIS — R51 Headache: Secondary | ICD-10-CM

## 2013-05-15 DIAGNOSIS — Z0000 Encounter for general adult medical examination without abnormal findings: Secondary | ICD-10-CM

## 2013-05-15 LAB — COMPREHENSIVE METABOLIC PANEL
ALT: 19 U/L (ref 0–35)
Alkaline Phosphatase: 32 U/L — ABNORMAL LOW (ref 39–117)
BUN: 10 mg/dL (ref 6–23)
CO2: 22 mEq/L (ref 19–32)
Calcium: 9.1 mg/dL (ref 8.4–10.5)
Creat: 0.8 mg/dL (ref 0.50–1.10)
Sodium: 137 mEq/L (ref 135–145)
Total Bilirubin: 0.2 mg/dL — ABNORMAL LOW (ref 0.3–1.2)
Total Protein: 6.7 g/dL (ref 6.0–8.3)

## 2013-05-15 LAB — POCT URINALYSIS DIPSTICK
Ketones, UA: NEGATIVE
Nitrite, UA: NEGATIVE
Protein, UA: NEGATIVE
Urobilinogen, UA: 0.2
pH, UA: 6

## 2013-05-15 LAB — LIPID PANEL
HDL: 44 mg/dL (ref >39)
LDL Cholesterol: 110 mg/dL — ABNORMAL HIGH (ref 0–99)
Total CHOL/HDL Ratio: 4.2 Ratio
Triglycerides: 150 mg/dL — ABNORMAL HIGH (ref <150)
VLDL: 30 mg/dL (ref 0–40)

## 2013-05-15 LAB — POCT CBC
Granulocyte percent: 61 %G (ref 37–80)
HCT, POC: 43.4 % (ref 37.7–47.9)
Hemoglobin: 13.6 g/dL (ref 12.2–16.2)
MCH, POC: 29.2 pg (ref 27–31.2)
MCV: 93.2 fL (ref 80–97)
POC LYMPH PERCENT: 31 %L (ref 10–50)
POC MID %: 8 %M (ref 0–12)
RBC: 4.66 M/uL (ref 4.04–5.48)
RDW, POC: 14.4 %

## 2013-05-15 LAB — POCT UA - MICROSCOPIC ONLY
Casts, Ur, LPF, POC: NEGATIVE
Mucus, UA: POSITIVE
Yeast, UA: NEGATIVE
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
## 2013-05-16 DIAGNOSIS — F419 Anxiety disorder, unspecified: Secondary | ICD-10-CM | POA: Insufficient documentation

## 2013-05-16 DIAGNOSIS — IMO0002 Reserved for concepts with insufficient information to code with codable children: Secondary | ICD-10-CM | POA: Insufficient documentation

## 2013-05-16 LAB — RPR

## 2013-05-16 LAB — HSV 2 ANTIBODY, IGG: HSV 2 Glycoprotein G Ab, IgG: <0.1 IV

## 2013-05-16 LAB — HEPATITIS C ANTIBODY: HCV Ab: NEGATIVE

## 2013-05-16 LAB — HIV ANTIBODY (ROUTINE TESTING W REFLEX): HIV: NONREACTIVE

## 2013-05-21 ENCOUNTER — Telehealth: Payer: Self-pay

## 2013-05-21 ENCOUNTER — Other Ambulatory Visit: Payer: Self-pay | Admitting: Physician Assistant

## 2013-05-21 DIAGNOSIS — R55 Syncope and collapse: Secondary | ICD-10-CM

## 2013-05-21 NOTE — Telephone Encounter (Signed)
Called and [facility] for patient regarding soft tissue neck and cardiology appt.

## 2013-05-21 NOTE — Telephone Encounter (Signed)
Lm for rtn call 

## 2013-05-21 NOTE — Telephone Encounter (Signed)
Pt is wanting to talk with Benny Lennert about her the soft tissue scan that has been ordered

## 2013-05-22 ENCOUNTER — Telehealth: Payer: Self-pay

## 2013-05-22 ENCOUNTER — Ambulatory Visit (INDEPENDENT_AMBULATORY_CARE_PROVIDER_SITE_OTHER): Payer: 59 | Admitting: Family Medicine

## 2013-05-22 ENCOUNTER — Telehealth: Payer: Self-pay | Admitting: Radiology

## 2013-05-22 ENCOUNTER — Ambulatory Visit (HOSPITAL_COMMUNITY)
Admission: RE | Admit: 2013-05-22 | Discharge: 2013-05-22 | Disposition: A | Discharge status: Home / Self Care | Payer: 59 | Source: Ambulatory Visit | Attending: Physician Assistant | Admitting: Physician Assistant

## 2013-05-22 ENCOUNTER — Ambulatory Visit (HOSPITAL_COMMUNITY): Admission: RE | Admit: 2013-05-22 | Payer: 59 | Source: Ambulatory Visit

## 2013-05-22 VITALS — BP 98/70 | HR 80 | Temp 98.6°F | Resp 18

## 2013-05-22 DIAGNOSIS — R51 Headache: Secondary | ICD-10-CM | POA: Insufficient documentation

## 2013-05-22 DIAGNOSIS — R000 Tachycardia, unspecified: Secondary | ICD-10-CM | POA: Insufficient documentation

## 2013-05-22 DIAGNOSIS — R42 Dizziness and giddiness: Secondary | ICD-10-CM

## 2013-05-22 DIAGNOSIS — R55 Syncope and collapse: Secondary | ICD-10-CM | POA: Insufficient documentation

## 2013-05-22 LAB — TSH: TSH: 1.382 u[IU]/mL (ref 0.350–4.500)

## 2013-05-22 LAB — GLUCOSE, POCT (MANUAL RESULT ENTRY): POC Glucose: 73 mg/dL (ref 70–99)

## 2013-05-22 MED ORDER — IOHEXOL 350 MG/ML SOLN
50.0000 mL | Freq: Once | INTRAVENOUS | Status: AC | PRN
  Administered 2013-05-22: 50 mL via INTRAVENOUS

## 2013-05-22 NOTE — Telephone Encounter (Signed)
Patient states her dizziness is worsening. I have advised her not to drive, since she describes her dizziness as severe. She indicates she has someone who can bring her in today. I have advised her the CT scan is normal and since her dizziness is worsening, she should come here or go to the Emergency Room. To you FYI

## 2013-05-22 NOTE — Telephone Encounter (Signed)
PT STATES SHE WAS SEEN BY DR LE EARLIER AND WOULD LIKE TO SPEAK WITH HER REGARDING THE OUT OF WORK NOTE SHE WAS GIVEN. PLEASE CALL [phone]

## 2013-05-22 NOTE — Telephone Encounter (Signed)
Called her , she states she needs length of time for her restrictions. She requests through the end of the week this is done.
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
## 2013-05-23 ENCOUNTER — Telehealth: Payer: Self-pay | Admitting: *Deleted

## 2013-05-23 DIAGNOSIS — R079 Chest pain, unspecified: Secondary | ICD-10-CM

## 2013-05-23 NOTE — Telephone Encounter (Signed)
SPOKE WITH PT  NEEDS  STRESS ECHO    AND  F/U WITH   DR Eden Emms  SAME DAY IF POSSIBLE  PER  DR NISHAN WILL FORWARD NOTE TO  SCHEDULERS./CY

## 2013-05-24 ENCOUNTER — Other Ambulatory Visit: Payer: Self-pay | Admitting: Family Medicine

## 2013-05-27 ENCOUNTER — Ambulatory Visit (INDEPENDENT_AMBULATORY_CARE_PROVIDER_SITE_OTHER): Payer: 59 | Admitting: Cardiovascular Disease

## 2013-05-27 ENCOUNTER — Ambulatory Visit (INDEPENDENT_AMBULATORY_CARE_PROVIDER_SITE_OTHER): Payer: 59 | Admitting: Cardiology

## 2013-05-27 ENCOUNTER — Telehealth: Payer: Self-pay

## 2013-05-27 ENCOUNTER — Encounter: Payer: Self-pay | Admitting: Cardiovascular Disease

## 2013-05-27 ENCOUNTER — Ambulatory Visit: Payer: Self-pay | Admitting: Cardiovascular Disease

## 2013-05-27 VITALS — BP 106/80 | HR 84 | Ht 67.0 in | Wt 207.0 lb

## 2013-05-27 DIAGNOSIS — R079 Chest pain, unspecified: Secondary | ICD-10-CM

## 2013-05-27 DIAGNOSIS — R209 Unspecified disturbances of skin sensation: Secondary | ICD-10-CM

## 2013-05-27 DIAGNOSIS — I951 Orthostatic hypotension: Secondary | ICD-10-CM | POA: Insufficient documentation

## 2013-05-27 DIAGNOSIS — R55 Syncope and collapse: Secondary | ICD-10-CM

## 2013-05-27 DIAGNOSIS — R002 Palpitations: Secondary | ICD-10-CM

## 2013-05-27 MED ORDER — MIDODRINE HCL 10 MG PO TABS: 10.0000 mg | ORAL_TABLET | Freq: Three times a day (TID) | ORAL | Status: DC | PRN

## 2013-05-27 MED ORDER — CLONAZEPAM 1 MG PO TABS: 1.0000 mg | ORAL_TABLET | Freq: Three times a day (TID) | ORAL | Status: DC | PRN

## 2013-05-27 NOTE — Assessment & Plan Note (Signed)
Symptoms date back many years to when she was a teenager. Appears to have had symptomatic orthostatic hypotension that has been  getting progressively worse. Now unable to exercise, symptomatic at work , episodes of syncope and near-syncope on a regular basis . In an effort to support her blood pressure from these drops, we have suggested she start midodrine 5 mg 3 times a day . If no significant improvement in her blood pressures, and if she continues to have symptoms, 10 mg 3 times a day could be used .  encouraged her to increase her fluid intake, salt intake, even consider wearing compression hose .   On treadmill but does not seem to be in appropriate response to exercise. On inappropriate finding was poor blood pressure response to exertion, even periods of hypotension with exercise. We did discuss other options such as  Florinef with her. This could be used if she has side effects on midodrine .  Final report of treadmill stress echo is pending . Prelim suggest normal echocardiogram .

## 2013-05-27 NOTE — Telephone Encounter (Signed)
Pt states Sandra Myers asked for status update on increased dosage of her klonopin. Pt states 'working great' and would like to request refill of her rx with newly increased dosage.  [facility] [phone]  bf

## 2013-05-27 NOTE — Patient Instructions (Signed)
You are doing well. Please start midodrine 5 mg three times a day Ok to increase dose up to 10 mg a day for low blood pressures and dizziness  Please call us if you have new issues that need to be addressed before your next appt.

## 2013-05-27 NOTE — Telephone Encounter (Signed)
Please advise 

## 2013-05-27 NOTE — Progress Notes (Signed)
Patient ID: Sandra Myers, female    DOB: 15-Jan-1981, 32 y.o.   MRN: [id number]  HPI Comments: Ms. 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 He reports having no prior cardiac workup.  She reports that at age 32, she had symptomatic orthostatic hypotension. She would have significant dizziness when raising from a supine position or after bending over. Approximately 6 years ago, she developed symptoms of lightheadedness and near syncope after her working out. She would  use her bike and after exercise, she was unable to stop exercising for a break with the others as if she did her blood pressure would drop and she would have lightheadedness. She would have to continue biking in order to hold off her symptoms. Currently she does not work out very much as after [employer] and doing aerobic activity , she would have lightheaded and passed out spells.   2 years ago she started developing symptoms with pressure in her head feeling like he was going to explode at times. Typically this would happen in a standing position after raising from a supine position . Symptoms were initially rare, developed and now they are approximately one time per day .  Last week she had an episode of dizziness while at work, systolic pressure in the 90s. Several weeks ago again was weak and dizzy with systolic pressures in the 90s.  She had an episode of syncope several weeks ago while she was at home. She goes from a supine position to a standing position and had syncope . Relatively quick recovery .  Stress echo earlier this morning showed heart rate increasing from 91 up to peak of 149 at peak exercise. Blood pressure did not move beyond systolic 122 the entire exercise. And into recovery. In fact it was 2  episodes of low blood pressure midway through stage II and the very end of recovery at 5 minutes she had blood pressure dropped to 100 systolic and felt dizzy   She has been drinking several Gatorade per day for blood pressure support.  She does report significant stress and anxiety. Less chest pain on Celexa and Klonopin   EKG today shows normal sinus rhythm with rate 84 beats per minute, no significant ST or T wave changes    Outpatient Encounter Prescriptions as of 05/27/2013  Medication Sig  . citalopram (CELEXA) 20 MG tablet Take 40 mg by mouth daily.  . clonazePAM (KLONOPIN) 1 MG tablet Take 1 mg by mouth 3 (three) times daily as needed for anxiety.  . traZODone (DESYREL) 50 MG tablet Take 150 mg by mouth at bedtime.   . [DISCONTINUED] clonazePAM (KLONOPIN) 0.5 MG tablet Take 1/2 tablet (0.25mg ) each morning, and 1 tablet (0.5mg ) at hs.    Review of Systems  Constitutional: Negative.   HENT: Negative.   Eyes: Negative.   Respiratory: Negative.   Cardiovascular: Negative.   Gastrointestinal: Negative.   Endocrine: Negative.   Musculoskeletal: Negative.   Skin: Negative.   Allergic/Immunologic: Negative.   Neurological: Positive for dizziness, syncope and light-headedness.       Near syncope  Hematological: Negative.   Psychiatric/Behavioral: Negative.   All other systems reviewed and are negative.    BP 106/80  Pulse 84  Ht 5\' 7"  (1.702 m)  Wt 207 lb (93.895 kg)  BMI 32.41 kg/m2  SpO2 98%  LMP 05/06/2013   Physical Exam  Nursing note and vitals reviewed. Constitutional: She is oriented to person, place, and time. She appears well-developed and well-nourished.  HENT:  Head: Normocephalic.  Nose: Nose normal.  Mouth/Throat: Oropharynx is clear and moist.  Eyes: Conjunctivae are normal. Pupils are equal, round, and reactive to light.  Neck: Normal range of motion. Neck supple. No JVD present.  Cardiovascular: Normal rate, regular rhythm, S1 normal, S2 normal,  normal heart sounds and intact distal pulses.  Exam reveals no gallop and no friction rub.   No murmur heard. Pulmonary/Chest: Effort normal and breath sounds normal. No respiratory distress. She has no wheezes. She has no rales. She exhibits no tenderness.  Abdominal: Soft. Bowel sounds are normal. She exhibits no distension. There is no tenderness.  Musculoskeletal: Normal range of motion. She exhibits no edema and no tenderness.  Lymphadenopathy:    She has no cervical adenopathy.  Neurological: She is alert and oriented to person, place, and time. Coordination normal.  Skin: Skin is warm and dry. No rash noted. No erythema.  Psychiatric: She has a normal mood and affect. Her behavior is normal. Judgment and thought content normal.    Assessment and Plan

## 2013-05-27 NOTE — Telephone Encounter (Signed)
I am so glad to hear it is helping.  Rx ready.

## 2013-05-28 NOTE — Telephone Encounter (Signed)
LMTCB  HAS SEEN  DR Lewie Loron IN [place] WITH  TESTS ALREADY DONE./CY

## 2013-05-31 NOTE — Telephone Encounter (Signed)
PT  COMPLAINING  WITH CHEST  PAIN  TODAY SINCE  STARTING  NEW MED  MIDODRINE  WILL FORWARD TO DR Mariah Milling FOR  REVIEW .ENCOURAGED PT  TO TRY  AND  TO  ALLOW MORE TIME TO PASS  TO  SEE IF  THERE IS IMPROVEMENT  IN SYMPTOMS./CY

## 2013-06-03 NOTE — Telephone Encounter (Signed)
Can recall to see if she is still having symptoms on the midodrine. Could try a midodrine 2.5 mg several times a day if he 5 mg causing symptoms. May take some time to get used to it. If she continues to have symptoms, would call us back, may need to try an alternate medication

## 2013-06-04 ENCOUNTER — Telehealth: Payer: Self-pay

## 2013-06-04 NOTE — Telephone Encounter (Signed)
Would start florinef 0.1 mg daily Would also continue midodrine 5 mg TID Need blood pressures on these meds when she feels ok,  Supine and standing

## 2013-06-04 NOTE — Telephone Encounter (Signed)
Spoke w/ pt.  Reports that she is no longer having chest pain but she is "still passing out." Reports that for the past 3 days, she can "feel it coming on, so I just lay down on the floor til it passes". States that she cannot get up to check her BP when she feels this way. States that she started midodrine on Friday, but is unsure if sx are r/t meds.  She would like to know if she should continue on midodrine at all or switch to something new. Please advise.  Thank you.

## 2013-06-04 NOTE — Telephone Encounter (Signed)
Left message for pt to call back  °

## 2013-06-04 NOTE — Telephone Encounter (Signed)
PT STATES [facility] CALLED TO LET HER KNOW THE MEDICATIONS WERE READY FOR PICK UP, SHE DOESN'T USE THEM ANYMORE BUT USES [facility], TOLD PT SHE CAN HAVE THE PHARMACY TRANSFER BUT WANTED [place] TO DO IT FOR HER INSTEAD. PLEASE CALL PT AT [phone] IF NEEDED

## 2013-06-04 NOTE — Telephone Encounter (Signed)
Cancelled at [facility] called to [facility]

## 2013-06-05 ENCOUNTER — Telehealth: Payer: Self-pay

## 2013-06-05 MED ORDER — FLUDROCORTISONE ACETATE 0.1 MG PO TABS: 0.1000 mg | ORAL_TABLET | Freq: Every day | ORAL | Status: DC

## 2013-06-05 NOTE — Telephone Encounter (Signed)
Patient is calling to say that she was supposed to have 2 prescriptions at the pharmacy she only has one please call her at [phone] or [phone]

## 2013-06-05 NOTE — Telephone Encounter (Signed)
Called her she wants Citalopram sent to [facility] pharmacy, was previously prescribed by another physician, but she is not going back there please advise.

## 2013-06-05 NOTE — Addendum Note (Signed)
Addended by: Rhea Belton R on: 06/05/2013 10:48 AM   Modules accepted: Orders

## 2013-06-05 NOTE — Telephone Encounter (Signed)
Spoke w/pt. °

## 2013-06-05 NOTE — Telephone Encounter (Signed)
Spoke w/ pt.  She is agreeable to starting florinef and continuing midodrine. Reports that she feels bad today.  She checked BP today, 163/93 HR 130 after walking around the unit. She is to monitor BP when feeling good and bad, orthostatics when possible and send results via MyChart.

## 2013-06-05 NOTE — Telephone Encounter (Signed)
Left message for pt to call back  °

## 2013-06-06 ENCOUNTER — Institutional Professional Consult (permissible substitution): Payer: Self-pay | Admitting: Cardiovascular Disease

## 2013-06-06 MED ORDER — CITALOPRAM HYDROBROMIDE 40 MG PO TABS: 40.0000 mg | ORAL_TABLET | Freq: Every day | ORAL | Status: DC

## 2013-06-06 NOTE — Telephone Encounter (Signed)
Sent in Rx to the pharmacy. 

## 2013-06-07 ENCOUNTER — Telehealth: Payer: Self-pay

## 2013-06-07 NOTE — Telephone Encounter (Signed)
Spoke with patient and let her know that prescription was sent into [facility] pharmacy.  She stated that she understood and would pick it up.

## 2013-06-07 NOTE — Telephone Encounter (Signed)
Spoke with patient per Dr. Windell Hummingbird request told the patient need to continue with the Midodrine 10 mg three times a day and start today taking Florinef daily and told her to start wearing the compression stockings and drink plenty of fluids. The patient has a follow up appointment with Dr.Klein on Thursday. Told the patient to contact our office if she continue to have the dizziness and feeling like could pass out.

## 2013-06-07 NOTE — Telephone Encounter (Signed)
Patient is taking Midodrine 10 mg three times a day, she has not started the Florinef yet. The patient's states her BP has been running 140/98 with a HR 75.

## 2013-06-07 NOTE — Telephone Encounter (Signed)
Patient is at work and has dizziness and headache with feeling like she can pass out. Please work [phone]

## 2013-06-12 ENCOUNTER — Ambulatory Visit (INDEPENDENT_AMBULATORY_CARE_PROVIDER_SITE_OTHER): Payer: 59 | Admitting: Physician Assistant

## 2013-06-12 ENCOUNTER — Other Ambulatory Visit: Payer: Self-pay | Admitting: Physician Assistant

## 2013-06-12 ENCOUNTER — Encounter: Payer: Self-pay | Admitting: Physician Assistant

## 2013-06-12 VITALS — BP 120/78 | HR 71 | Temp 98.2°F | Resp 16 | Ht 66.5 in | Wt 205.2 lb

## 2013-06-12 DIAGNOSIS — F341 Dysthymic disorder: Secondary | ICD-10-CM

## 2013-06-12 DIAGNOSIS — F418 Other specified anxiety disorders: Secondary | ICD-10-CM

## 2013-06-12 DIAGNOSIS — I951 Orthostatic hypotension: Secondary | ICD-10-CM

## 2013-06-12 DIAGNOSIS — R55 Syncope and collapse: Secondary | ICD-10-CM

## 2013-06-12 MED ORDER — VENLAFAXINE HCL ER 37.5 MG PO CP24: 37.5000 mg | ORAL_CAPSULE | Freq: Every day | ORAL | Status: DC

## 2013-06-12 NOTE — Patient Instructions (Signed)
Week 1 Decrease the Celexa to 20mg  qd Start Effexor XR 37.5mg  qd  Week 2 Decrease the Celexa 20mg  every other day Increase Effexor XR 75mg   Week 3 Stop Celexa

## 2013-06-12 NOTE — Progress Notes (Signed)
   Subjective:    Patient ID: Sandra Myers, female    DOB: [birth year], 32 y.o.   MRN: [id number]  HPI  Pt presents to clinic for recheck.   She has been to the cardiologist since her last visit and they are working with the diagnosis of orthostatic hypotension.  She was started on midodrine but was not working.  5 days ago she was started on Florinef but she is unsure if that is working - she has noted that her BP are higher than normal and that is making her feel bad.  She has an appt with Dr Graciela Husbands tomorrow - she is wondering about a holter to monitor her symptoms and look for associated heart rhythm.  Dr Eden Emms wonders if Effexor might be a good choice for her depression/anxiety because that might help with her hypotension. She has noticed that recently she has become very frustrated with the medication system and the fact that we cannot figure out what is going on with her.  She has noticed that her depression is worse that it has been - The Klonopin is helping with her anxiety.  She still gets some of the physical complaints but the mental anxiety is much improved.  She finds that she takes it most mornings and most evenings - though some evenings she does not because she is so physically exhausted after work she comes home and sleeps (though the sleep is not always the greatest.    Review of Systems     Objective:   Physical Exam  Vitals reviewed. Constitutional: She is oriented to person, place, and time. She appears well-developed and well-nourished.  HENT:  Head: Normocephalic and atraumatic.  Right Ear: External ear normal.  Left Ear: External ear normal.  Pulmonary/Chest: Effort normal.  Neurological: She is alert and oriented to person, place, and time.  Skin: Skin is warm and dry.  Psychiatric: She has a normal mood and affect. Her behavior is normal. Judgment and thought content normal.       Assessment & Plan:  Depression with anxiety - Plan: venlafaxine XR (EFFEXOR XR)  37.5 MG 24 hr capsule Orthostatic hypotension -   Pt has a complicated history with multiple issues.  She will continue with her cardiology work-up and we will check on her neuro referral for her headaches.  Due to the suggestion of the cardiologist and her depression seeming to get worse with her underlying anxiety we will taper off the Celexa and start Effexor.  D/w pt that she may noticed increase anxiety over the next several weeks she will continue her Klonopin up to tid.  She will f/u with me by phone in 2 weeks and we will plan on a recheck in about 2 months for follow-up.  Sharri Loya PA-C 06/12/2013 5:22 PM

## 2013-06-13 ENCOUNTER — Other Ambulatory Visit (HOSPITAL_COMMUNITY): Payer: Self-pay

## 2013-06-13 ENCOUNTER — Encounter: Payer: Self-pay | Admitting: Internal Medicine

## 2013-06-13 ENCOUNTER — Ambulatory Visit (INDEPENDENT_AMBULATORY_CARE_PROVIDER_SITE_OTHER): Payer: 59 | Admitting: Internal Medicine

## 2013-06-13 VITALS — BP 112/76 | HR 76 | Ht 66.5 in | Wt 206.0 lb

## 2013-06-13 DIAGNOSIS — I951 Orthostatic hypotension: Secondary | ICD-10-CM

## 2013-06-13 MED ORDER — MIDODRINE HCL 10 MG PO TABS: ORAL_TABLET | ORAL | Status: DC

## 2013-06-13 NOTE — Patient Instructions (Addendum)
[email or web address].es  HOB 6 inches Drink plenty of water Thermatabs Shower at night  Your physician has recommended you make the following change in your medication:  1) Take Proamatine at 7am, 11am, and 3 pm  You will follow up with Dr. Mariah Milling and let us know if you need to see Dr. Graciela Husbands again.

## 2013-06-13 NOTE — Progress Notes (Signed)
c   ELECTROPHYSIOLOGY CONSULT NOTE  Patient ID: Sandra Myers, MRN: [id number], DOB/AGE: 08-17-1980 32 y.o. Admit date: (Not on file) Date of Consult: 06/13/2013  Primary Physician: Virgilio Belling Primary Cardiologist TG Chief Complaint:     HPI Sandra Myers is a 32 y.o. female  Seen at her own request he cousin a long-standing history of orthostatic dizziness. She dates this back to when she was about 32 years old. She noticed that when she bent over and stood up she would get dizzy. In the last 5-6 months has become considerably worse with episodes of syncope and presyncope. They're characterized by typical prodrome including paresthesias flushing diaphoresis and nausea and is notable also for significant recovery fatigue.  She is shower intolerance, heat intolerance, exercise intolerance manifested by dizziness pausing during exercise. She has significant symptoms in the morning and actually showers in the morning. She was started on ProAmatine and rose to a high dose of 10 mg. She takes it however, she every 5 hours and she notes that while at work the beginning of the interval it does not work at the end of the interval. She was recently started on Florinef.  She is a long-standing history of depression and anxiety. He did not address these issues. She has been seen by psychiatry and is undergoing counseling. She takes medication for sleep, anxiety and is also on an antidepressant which has been modestly effective. s her diet is replete of fluids drinking 3 bottles of Gatorade a day; it is deplete of salt. I failed to ask her about her menstrual period.  She also describes difficulties with balance at the end of the day that are unrelated to hypotension. She is scheduled to see neurology tomorrow. I wonder whether this may also not be related to some of her neuroleptic medications.   It was recently recommended that she change her citalopram to Effexor.  Cardiac evaluation has  been notable for echo that was normal and a stress test performed for the inappropriate lack of rise of blood pressure with exercise and in fact some decrease of blood pressure with exercise       Past Medical History  Diagnosis Date  . Back pain   . Depression   . Anxiety   . Syncope and collapse       Surgical History:  Past Surgical History  Procedure Laterality Date  . Facial reconstruction surgery  2012, 2013    3 surgeries  . Appendectomy  Age 56  . Tonsillectomy and adenoidectomy  Age 76     Home Meds: Prior to Admission medications   Medication Sig Start Date End Date Taking? Authorizing Provider  citalopram (CELEXA) 40 MG tablet Take 1 tablet (40 mg total) by mouth daily. 06/05/13  Yes Morrell Riddle, PA-C  clonazePAM (KLONOPIN) 1 MG tablet Take 1 tablet (1 mg total) by mouth 3 (three) times daily as needed for anxiety. 05/27/13  Yes Morrell Riddle, PA-C  fludrocortisone (FLORINEF) 0.1 MG tablet Take 1 tablet (0.1 mg total) by mouth daily. 06/05/13  Yes Antonieta Iba, MD  midodrine (PROAMATINE) 10 MG tablet Take one tablet (10 mg total) by mouth 3 (three) times daily - at 7am, 11am, and 3 pm. 06/13/13  Yes Duke Salvia, MD  traZODone (DESYREL) 50 MG tablet Take 150 mg by mouth at bedtime.  02/08/13  Yes Verne Spurr, PA-C  venlafaxine XR (EFFEXOR XR) 37.5 MG 24 hr capsule Take 1-2 capsules (37.5-75 mg total) by  mouth daily with breakfast. 06/12/13  Yes Morrell Riddle, PA-C      Allergies: No Known Allergies  History   Social History  . Marital Status: Married    Spouse Name: N/A    Number of Children: N/A  . Years of Education: N/A   Occupational History  . Not on file.   Social History Main Topics  . Smoking status: Current Every Day Smoker -- 1.00 packs/day    Types: Cigarettes    Last Attempt to Quit: 01/16/2003  . Smokeless tobacco: Not on file     Comment: Patient quit in 2004 but recently started back 3 months ago.   . Alcohol Use: 0.0 oz/week      Comment: OCCASIONALLY, h/o misuse for anxiety   . Drug Use: No  . Sexual Activity: Yes    Partners: Female   Other Topics Concern  . Not on file   Social History Narrative   02/11/2013 AHW Kristian was born in [place], [place], and grew up [place], [place]. She has 4 half-siblings. She reports that her childhood was "not good." She has achieved 2 associates degrees, and AS in math, and an AAS in radiology. 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 She is recently separated from her partner of 10 years. They have been married for the past 3 years. She affiliates as Curator. She reports that her social support system consists of her many friends. Her hobbies include hiking, cycling, woodworking, and reading. 02/11/2013 AHW     Family History  Problem Relation Age of Onset  . Bipolar disorder Cousin   . Hyperlipidemia Mother   . Hypertension Mother   . Hypertension Maternal Grandmother   . Hyperlipidemia Maternal Grandfather   . Other Maternal Grandfather     carotoid stentosis  . Depression Father   . Depression Brother      ROS:  Please see the history of present illness.     All other systems reviewed and negative.    Physical Exam:   Blood pressure 112/76, pulse 76, height 5' 6.5" (1.689 m), weight 206 lb (93.441 kg), last menstrual period 05/31/2013. General: Well developed, well nourished female in no acute distress. Head: Normocephalic, atraumatic, sclera non-icteric, no xanthomas, nares are without discharge. EENT: normal Lymph Nodes:  none Back: without scoliosis/kyphosis , no CVA tendersness Neck: Negative for carotid bruits. JVD not elevated. Lungs: Clear bilaterally to auscultation without wheezes, rales, or rhonchi. Breathing is unlabored. Heart: RRR with S1 S2. No * murmur , rubs, or gallops appreciated. Abdomen: Soft, non-tender, non-distended with normoactive bowel sounds. No hepatomegaly.  No rebound/guarding. No obvious abdominal masses. Msk:  Strength and tone appear normal for age. Extremities: No clubbing or cyanosis. No edema.  Distal pedal pulses are 2+ and equal bilaterally. Skin: Warm and Dry Neuro: Alert and oriented X 3. CN III-XII intact Grossly normal sensory and motor function . Psych:  Responds to questions appropriately with a sad affect.      Labs: Cardiac Enzymes No results found for this basename: CKTOTAL, CKMB, TROPONINI,  in the last 72 hours CBC Lab Results  Component Value Date   WBC 7.3 05/15/2013   HGB 13.6 05/15/2013   HCT 43.4 05/15/2013   MCV 93.2 05/15/2013   PLT 240 02/06/2013   PROTIME: No results found for this basename: LABPROT, INR,  in the last 72 hours Chemistry  No results found for this basename: NA, K, CL, CO2, BUN, CREATININE, CALCIUM, LABALBU, PROT, BILITOT, ALKPHOS, ALT, AST, GLUCOSE,  in the last 168 hours Lipids Lab Results  Component Value Date   CHOL 184 05/15/2013   HDL 44 05/15/2013   LDLCALC 110* 05/15/2013   TRIG 150* 05/15/2013   BNP No results found for this basename: probnp   Miscellaneous No results found for this basename: DDIMER    Radiology/Studies:  Ct Angio Head W/cm &/or Wo Cm  05/22/2013   CLINICAL DATA:  Headache. Near syncope when turning head to the left. Tachycardia which is positional.  EXAM: CT ANGIOGRAPHY HEAD AND NECK  TECHNIQUE: Multidetector CT imaging of the head and neck was performed using the standard protocol during bolus administration of intravenous contrast. Multiplanar CT image reconstructions including MIPs were obtained to evaluate the vascular anatomy. Carotid stenosis measurements (when applicable) are obtained utilizing NASCET criteria, using the distal internal carotid diameter as the denominator.  CONTRAST:  50mL OMNIPAQUE IOHEXOL 350 MG/ML SOLN  COMPARISON:  Head CT 01/04/2012  FINDINGS: CTA HEAD FINDINGS  The brain itself has normal appearance without evidence of malformation,  atrophy, old or acute infarction, mass lesion, hemorrhage, hydrocephalus or extra-axial collection. No abnormal contrast enhancement.  Both internal carotid arteries are widely patent into the brain. No siphon stenosis. The anterior and middle cerebral vessels are normal without proximal stenosis, aneurysm or vascular malformation.  Both vertebral arteries are approximately equal in size an widely patent through the foramen magnum to the basilar. Posterior circulation branch vessels appear normal. The basilar artery appears normal.  Venous structures appear normal.  Review of the MIP images confirms the above findings.  CTA NECK FINDINGS  Lung apices are clear.  No superior mediastinal pathology.  Branching pattern of the brachiocephalic vessels from the arch is normal. No origin stenoses. Both common carotid arteries take a normal course to the bifurcation. Both carotid bifurcations are normal without stenosis or irregularity. Both cervical internal carotid arteries take a normal course.  Both vertebral artery origins arise in a normal location and are widely patent. Both cervical vertebral arteries are normal, entering the transverse foramina at a normal location of C6. Both vertebral arteries appear normal without evidence of tortuosity or fibromuscular disease.  Review of the MIP images confirms the above findings.  IMPRESSION: This examination is entirely normal. There is no evidence of acquired or congenital vascular pathology that I can relate to the describe symptoms.   Electronically Signed   By: Paulina Fusi M.D.   On: 05/22/2013 10:00   Ct Angio Neck W/cm &/or Wo/cm  05/22/2013   CLINICAL DATA:  Headache. Near syncope when turning head to the left. Tachycardia which is positional.  EXAM: CT ANGIOGRAPHY HEAD AND NECK  TECHNIQUE: Multidetector CT imaging of the head and neck was performed using the standard protocol during bolus administration of intravenous contrast. Multiplanar CT image  reconstructions including MIPs were obtained to evaluate the vascular anatomy. Carotid stenosis measurements (when applicable) are obtained utilizing NASCET criteria, using the distal internal carotid diameter as the denominator.  CONTRAST:  50mL OMNIPAQUE IOHEXOL 350 MG/ML SOLN  COMPARISON:  Head CT 01/04/2012  FINDINGS: CTA HEAD FINDINGS  The brain itself has normal appearance without evidence of malformation, atrophy, old or acute infarction, mass lesion, hemorrhage, hydrocephalus or extra-axial collection. No abnormal contrast enhancement.  Both internal carotid arteries are widely patent into the brain. No siphon stenosis. The anterior and middle cerebral vessels are normal without proximal  stenosis, aneurysm or vascular malformation.  Both vertebral arteries are approximately equal in size an widely patent through the foramen magnum to the basilar. Posterior circulation branch vessels appear normal. The basilar artery appears normal.  Venous structures appear normal.  Review of the MIP images confirms the above findings.  CTA NECK FINDINGS  Lung apices are clear.  No superior mediastinal pathology.  Branching pattern of the brachiocephalic vessels from the arch is normal. No origin stenoses. Both common carotid arteries take a normal course to the bifurcation. Both carotid bifurcations are normal without stenosis or irregularity. Both cervical internal carotid arteries take a normal course.  Both vertebral artery origins arise in a normal location and are widely patent. Both cervical vertebral arteries are normal, entering the transverse foramina at a normal location of C6. Both vertebral arteries appear normal without evidence of tortuosity or fibromuscular disease.  Review of the MIP images confirms the above findings.  IMPRESSION: This examination is entirely normal. There is no evidence of acquired or congenital vascular pathology that I can relate to the describe symptoms.   Electronically Signed   By:  Paulina Fusi M.D.   On: 05/22/2013 10:00    EKG:  nsr   Assessment and Plan:   Orthostatic lightheadedness  Anxiety Depression  The patient has a long-standing history of orthostatic intolerance that has worsened markedly coincidentally with the initiation of antidepressant therapy certainly raising the specter of a causal association. She is working in the issues related to the latter to her credit.  For symptoms of orthostatic intolerance are quite striking but not totally consistent in that she has had balance issues of late unassociated with hypotension. This raises the concern in my mind as to whether they may be related to some of the neuroleptic medications.  As relates to orthostatic intolerance and inappropriate vasodilatation as supported by her symptoms and demonstrated on her stress test we have discussed the physiology extensively and some therapeutic maneuvers that may be a assistance.  These would include 1 increasing sodium intake. She does not like salt so we have advise thermtabs  2 three times a day,  2-taking her ProAmatine upon her awakening and every 4 hours while awake 3-continuing with her Florinef for the short term. She will need a metabolic profile in 2-3 weeks. 4-raise  head of her bed, water upon arising, and showering in the evening. 5-continue to pursue therapy 6-cardiac fitness training. 7. [email or web address].[email or web address] website information    Sherryl Manges

## 2013-06-13 NOTE — Progress Notes (Signed)
Little dizzy standing.

## 2013-06-14 ENCOUNTER — Institutional Professional Consult (permissible substitution): Payer: Self-pay | Admitting: Cardiovascular Disease

## 2013-06-14 ENCOUNTER — Encounter: Payer: Self-pay | Admitting: Neurology

## 2013-06-14 ENCOUNTER — Other Ambulatory Visit (HOSPITAL_COMMUNITY): Payer: Self-pay

## 2013-06-14 ENCOUNTER — Ambulatory Visit (INDEPENDENT_AMBULATORY_CARE_PROVIDER_SITE_OTHER): Payer: 59 | Admitting: Neurology

## 2013-06-14 VITALS — BP 122/85 | HR 61 | Ht 66.0 in | Wt 205.0 lb

## 2013-06-14 DIAGNOSIS — F329 Major depressive disorder, single episode, unspecified: Secondary | ICD-10-CM

## 2013-06-14 DIAGNOSIS — F411 Generalized anxiety disorder: Secondary | ICD-10-CM

## 2013-06-14 DIAGNOSIS — R519 Headache, unspecified: Secondary | ICD-10-CM | POA: Insufficient documentation

## 2013-06-14 DIAGNOSIS — F419 Anxiety disorder, unspecified: Secondary | ICD-10-CM

## 2013-06-14 DIAGNOSIS — R51 Headache: Secondary | ICD-10-CM | POA: Insufficient documentation

## 2013-06-14 NOTE — Progress Notes (Signed)
[facility]  PATIENT: Sandra Myers DOB: 03-Nov-1980  HISTORICAL  Sandra Myers  is a 32 yo RH Caucasian female, referred by her primary care Nakyah Erdmann for evaluation of passing out episode, headaches,  She had past medical history of depression, anxiety, had multiple facial reconstruction surgery in the past, presenting with frequent headaches,  Over the past 5 years, she complains of intermittent headache, especially when she turns her head quickly to the left side, she felt pressure building up in her head, she has to pulse for a few seconds, difficulty talking, walking, never had loss of consciousness, no significant visual trouble  Her symptoms has become more frequent since November 2014, now she has multiple episodes, 6-10 in the weeks, sometimes without turning her head, but this always happens when she was standing up from seated, or lying down position, she felt transient pressure building up in her head, difficulty talking, walking for 30 seconds, she has to pause.  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 Mariah Milling, per record, stress echo showed heart rate increasing from 91 up to peak of 149 at peak exercise. Blood pressure did not move beyond systolic 122 the entire exercise and into recovery. She had 2 episodes of low blood pressure midway  through stage II and the very end of recovery at 5 minutes,  she had blood pressure dropped to 100 systolic and felt dizzy.   She also complains of unbalanced gait, bump into things. She was recently started on Celexa to 40 mg a day, clonazepam 1 mg 3 times a day, also fluorocortisone 0.1 mg daily, midodrine 10 mg 3 times daily, trazodone 50 mg a day  I have reviewed CT angiogram of brain, neck, CT of head with without contrast together with patient that was essentially normal, there was no evidence of large vessel disease,,     there was no significant orthostatic blood pressure change, 20 beats increased heart rate when standing up, blood pressure lying down 119 over 85, heart rate of 65, standing 114 over 85, heart rate of 72 sitting, 122 over 85, heart rate of 61    REVIEW OF SYSTEMS: Full 14 system review of systems performed and notable only for fatigue, chest pain, palpitation, hearing loss, ringing ears, shortness of breath, diarrhea, easy bruising, feeling cold, increased thirst, skin sensitivity, headaches, numbness, weakness, passing out, tremor, insomnia, sleepiness, depression, anxiety, not enough sleep, decreased energy, change in appetite, disinterested in activities, suicidal thoughts, racing thoughts,   ALLERGIES: No Known Allergies  HOME MEDICATIONS: Outpatient Prescriptions Prior to Visit  Medication Sig Dispense Refill  . citalopram (CELEXA) 40 MG tablet Take 1 tablet (40 mg total) by mouth daily.  90 tablet since July 2014  0  . clonazePAM (KLONOPIN) 1 MG tablet Take 1 tablet (1 mg total) by mouth 3 (three) times daily as needed for  anxiety. Since July 2014  90 tablet  0  . fludrocortisone (FLORINEF) 0.1 MG tablet Take 1 tablet (0.1 mg total) by mouth daily. Since 06/2013  30 tablet  6  . midodrine (PROAMATINE) 10 MG tablet Take one tablet (10 mg total) by mouth 3 (three) times daily - at 7am, 11am, and 3 pm.  90 tablet  6  . traZODone (DESYREL) 50 MG tablet Take 150 mg by  mouth at bedtime.          PAST MEDICAL HISTORY: Past Medical History  Diagnosis Date  . Back pain   . Depression   . Anxiety   . Syncope and collapse   . HA (headache)   . Syncope     PAST SURGICAL HISTORY: Past Surgical History  Procedure Laterality Date  . Facial reconstruction surgery  2012, 2013    3 surgeries  . Appendectomy  Age 61  . Tonsillectomy and adenoidectomy  Age 34    FAMILY HISTORY: Family History  Problem Relation Age of Onset  . Bipolar disorder Cousin   . Hyperlipidemia Mother   . Hypertension Mother   . Hypertension Maternal Grandmother   . Hyperlipidemia Maternal Grandfather   . Other Maternal Grandfather     carotoid stentosis  . Depression Father   . Depression Brother     SOCIAL HISTORY:  History   Social History  . Marital Status: Married    Spouse Name: N/A    Number of Children: 0  . Years of Education: college   Occupational History  . [employer] SUPERVISOR [employer]   Social History Main Topics  . Smoking status: Current Every Day Smoker -- 1.00 packs/day    Types: Cigarettes    Last Attempt to Quit: 01/16/2003  . Smokeless tobacco: Never Used     Comment: Patient quit in 2004 but recently started back 3 months ago.   . Alcohol Use: No     Comment: OCCASIONALLY, h/o misuse for anxiety   . Drug Use: No  . Sexual Activity: Yes    Partners: Female   Other Topics Concern  . Not on file   Social History Narrative   02/11/2013 Sandra Myers was born in [place], [place], and grew up [place], [place]. She has 4 half-siblings. She reports that her childhood was "not good." She has achieved 2 associates degrees, and AS in math, and an AAS in radiology. 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 She is recently separated from her partner of 10 years. They have been married for the past 3 years. She affiliates as Curator. She reports that her social support system  consists of her many friends. Her hobbies include hiking, cycling, woodworking, and reading. 02/11/2013 Sandra         Patient lives at home with her room mate.   Patient is se prated    Education- College   Right handed.   Caffeine- some           PHYSICAL EXAM   Filed Vitals:   06/14/13 1303  BP: 122/85  Pulse: 61  Height: 5\' 6"  (1.676 m)  Weight: 205 lb (92.987 kg)    Not recorded    Body mass index is 33.1 kg/(m^2).   Generalized: In no acute distress  Neck: Supple, no carotid bruits   Cardiac: Regular rate rhythm  Pulmonary: Clear to auscultation bilaterally  Musculoskeletal: No deformity  Neurological examination  Mentation: Alert oriented to time, place, history taking, and causual conversation  Cranial nerve II-XII: Pupils were equal round reactive to light extraocular movements were full, Visual field were full on confrontational test. Bilateral fundi were sharp.  Facial sensation and strength were normal. Hearing was intact to finger rubbing bilaterally. Uvula tongue midline.  head turning and shoulder shrug and were normal and symmetric.Tongue protrusion into cheek strength was normal.  Motor: normal tone, bulk and strength.  Sensory: Intact to fine touch, pinprick, preserved vibratory sensation, and proprioception at toes.  Coordination: Normal finger to nose, heel-to-shin bilaterally there was no truncal ataxia  Gait: Rising up from seated position without assistance, normal stance, without trunk ataxia, moderate stride, good arm swing, smooth turning, able to perform tiptoe, and heel walking without difficulty.   Romberg signs: Negative  Deep tendon reflexes: Brachioradialis 2/2, biceps 2/2, triceps 2/2, patellar 2/2, Achilles 2/2, plantar responses were flexor bilaterally.   DIAGNOSTIC DATA (LABS, IMAGING, TESTING) - I reviewed patient records, labs, notes, testing and imaging myself where available.  Lab Results  Component Value Date   WBC  7.3 05/15/2013   HGB 13.6 05/15/2013   HCT 43.4 05/15/2013   MCV 93.2 05/15/2013   PLT 240 02/06/2013      Component Value Date/Time   NA 137 05/15/2013 1515   K 4.0 05/15/2013 1515   CL 106 05/15/2013 1515   CO2 22 05/15/2013 1515   GLUCOSE 81 05/15/2013 1515   BUN 10 05/15/2013 1515   CREATININE 0.80 05/15/2013 1515   CREATININE 1.10 02/06/2013 2000   CALCIUM 9.1 05/15/2013 1515   PROT 6.7 05/15/2013 1515   ALBUMIN 4.0 05/15/2013 1515   AST 14 05/15/2013 1515   ALT 19 05/15/2013 1515   ALKPHOS 32* 05/15/2013 1515   BILITOT 0.2* 05/15/2013 1515   GFRNONAA 65* 02/06/2013 2000   GFRAA 75* 02/06/2013 2000   Lab Results  Component Value Date   CHOL 184 05/15/2013   HDL 44 05/15/2013   LDLCALC 110* 05/15/2013   TRIG 150* 05/15/2013   CHOLHDL 4.2 05/15/2013   Lab Results  Component Value Date   HGBA1C 5.0 01/16/2013   No results found for this basename: VITAMINB12   Lab Results  Component Value Date   TSH 1.382 05/22/2013      ASSESSMENT AND PLAN   32 years old Caucasian female, with constellation of complaints detailed above, including frequent headaches, passing out episodes, essentially normal imaging study, cardiac workup,and neurological examination, she has depression anxiety, recent polypharmacy treatment,  1, her symptoms are most likely related to her mood disorder, polypharmacy treatment  2, I have advised her to continue observing her symptoms  3, return to clinic in 3 months with Sandra Myers, M.D. Ph.D.  [facility] [address] [place], [place] [address] [phone]

## 2013-08-01 ENCOUNTER — Other Ambulatory Visit: Payer: Self-pay

## 2013-08-01 MED ORDER — CLONAZEPAM 1 MG PO TABS: 1.0000 mg | ORAL_TABLET | Freq: Three times a day (TID) | ORAL | Status: DC | PRN

## 2013-08-01 NOTE — Telephone Encounter (Signed)
Pharm reqs RF of clonazepam. Pended. 

## 2013-08-01 NOTE — Telephone Encounter (Signed)
Ready

## 2013-08-02 NOTE — Telephone Encounter (Signed)
faxed

## 2013-08-21 ENCOUNTER — Encounter: Payer: Self-pay | Admitting: Physician Assistant

## 2013-08-21 ENCOUNTER — Ambulatory Visit (INDEPENDENT_AMBULATORY_CARE_PROVIDER_SITE_OTHER): Payer: 59 | Admitting: Physician Assistant

## 2013-08-21 VITALS — BP 136/96 | HR 77 | Temp 98.1°F | Resp 16 | Ht 66.5 in | Wt 215.0 lb

## 2013-08-21 DIAGNOSIS — F32A Depression, unspecified: Secondary | ICD-10-CM

## 2013-08-21 DIAGNOSIS — R21 Rash and other nonspecific skin eruption: Secondary | ICD-10-CM

## 2013-08-21 DIAGNOSIS — Z8639 Personal history of other endocrine, nutritional and metabolic disease: Secondary | ICD-10-CM

## 2013-08-21 DIAGNOSIS — F3289 Other specified depressive episodes: Secondary | ICD-10-CM

## 2013-08-21 DIAGNOSIS — F329 Major depressive disorder, single episode, unspecified: Secondary | ICD-10-CM

## 2013-08-21 DIAGNOSIS — Z862 Personal history of diseases of the blood and blood-forming organs and certain disorders involving the immune mechanism: Secondary | ICD-10-CM

## 2013-08-21 DIAGNOSIS — R51 Headache: Secondary | ICD-10-CM

## 2013-08-21 DIAGNOSIS — Z87798 Personal history of other (corrected) congenital malformations: Secondary | ICD-10-CM

## 2013-08-21 DIAGNOSIS — I951 Orthostatic hypotension: Secondary | ICD-10-CM

## 2013-08-21 MED ORDER — CITALOPRAM HYDROBROMIDE 40 MG PO TABS: 40.0000 mg | ORAL_TABLET | Freq: Every day | ORAL | Status: DC

## 2013-08-21 MED ORDER — BETAMETHASONE DIPROPIONATE AUG 0.05 % EX OINT: TOPICAL_OINTMENT | Freq: Two times a day (BID) | CUTANEOUS | Status: DC

## 2013-08-21 MED ORDER — CYCLOBENZAPRINE HCL 5 MG PO TABS: 5.0000 mg | ORAL_TABLET | Freq: Every day | ORAL | Status: DC

## 2013-08-21 NOTE — Progress Notes (Signed)
   Subjective:    Patient ID: Sandra Myers, female    DOB: 18-Mar-1981, 33 y.o.   MRN: [id number]  HPI  Pt presents to clinic for recheck of multiple problems. 1- orthostatic hypotension - followed by cardiologist - she is much better while on the meds - she still has a few episodes of feeling like she is going to pass out when she takes a hot shower in the am - she has been instructed that this is a possibility  2- headaches - she saw neurologist - but was told nothing was wrong and they could deal with the pressure she feels in her head when she stands up after sitting for long periods of time and then develops this intense pressure but no pain - she is seeing a chiropractor and they are helping the pain in her neck and headaches - she is interested in getting a 2nd opinion 3- anxiety is much better - her nightmares are gone.  She has decreased her Klonopin dose to bid and she feels like her anxiety is better - she is continuing her therapy - she is in a new relationship with a long time friend and is really happy and she feels that has helped a lot with her mood. 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 Constitutional: She is oriented to person, place, and time. She appears well-developed and well-nourished.  HENT:  Head: Normocephalic and atraumatic.  Right Ear: External ear normal.  Left Ear: External ear normal.  Neck:    Pulmonary/Chest: Effort normal.  Neurological: She is alert and oriented to person, place, and time.  Skin: Skin is warm and dry. Rash (3 inch  area with erythematous border and blisters and central clearing - no scaling - I was able to scrap no skin off the area) noted.  Psychiatric: She has a normal mood and affect. Her behavior is normal. Judgment and thought content normal.      Assessment & Plan:  Headache(784.0) -We will send for a 2nd opinion - The pressure in her head sounds like it may be from her hypotension - Plan: Ambulatory referral to Neurology, cyclobenzaprine (FLEXERIL) 5 MG tablet  H/O thyroglossal duct cyst - check US - if not scar tissue will refer to ENT - Plan: US Soft Tissue Head/Neck  Depression - Continue current medication - pt will call when she needs the Klonopin - she will try a decreased dosage in the am to see if that controls her anxiety during the day - she will continue therapy - Plan: citalopram (CELEXA) 40 MG tablet  Rash - looks like dyshydrotic eczema and will try steroid cream and if the rash gets worse we will know it was fungal and switch to an anti-fungal cream-  Plan: augmented betamethasone dipropionate (DIPROLENE) 0.05 % ointment  Orthostatic hypotension - continue cardiology treatment  Benny LennertSarah Weber [facility] 08/21/2013 5:19 PM

## 2013-08-23 ENCOUNTER — Ambulatory Visit (HOSPITAL_COMMUNITY)
Admission: RE | Admit: 2013-08-23 | Discharge: 2013-08-23 | Disposition: A | Discharge status: Home / Self Care | Payer: 59 | Source: Ambulatory Visit | Attending: Physician Assistant | Admitting: Physician Assistant

## 2013-08-23 DIAGNOSIS — R220 Localized swelling, mass and lump, head: Secondary | ICD-10-CM | POA: Insufficient documentation

## 2013-08-23 DIAGNOSIS — R221 Localized swelling, mass and lump, neck: Principal | ICD-10-CM

## 2013-08-23 DIAGNOSIS — Z87798 Personal history of other (corrected) congenital malformations: Secondary | ICD-10-CM

## 2013-09-19 ENCOUNTER — Telehealth: Payer: Self-pay | Admitting: Nurse Practitioner

## 2013-09-19 ENCOUNTER — Ambulatory Visit: Payer: 59 | Admitting: Nurse Practitioner

## 2013-09-19 NOTE — Telephone Encounter (Signed)
No show for scheduled appt 

## 2013-10-07 ENCOUNTER — Other Ambulatory Visit: Payer: Self-pay | Admitting: Physician Assistant

## 2013-10-10 NOTE — Telephone Encounter (Signed)
Ready

## 2013-10-11 NOTE — Telephone Encounter (Signed)
Faxed

## 2013-10-25 ENCOUNTER — Telehealth: Payer: Self-pay

## 2013-10-25 ENCOUNTER — Telehealth: Payer: Self-pay | Admitting: Internal Medicine

## 2013-10-25 MED ORDER — PROPRANOLOL HCL 10 MG PO TABS: 10.0000 mg | ORAL_TABLET | Freq: Two times a day (BID) | ORAL | Status: AC

## 2013-10-25 NOTE — Telephone Encounter (Signed)
F/u   Pt returning Sandra Myers call.

## 2013-10-25 NOTE — Telephone Encounter (Signed)
Lying bp= 115/76 p= 81 Sitting bp= 125/85 p= 89 Standing bp= 119/87 p= 96 After 1 min bp= 124/91 p= 136 After 3 min bp= 123/92 p= 101  Will make dr Tenny Crawross aware

## 2013-10-25 NOTE — Telephone Encounter (Signed)
Spoke with pt, for 1 & 1/2 weeks she has noticed increased SOB with exertion, chest discomfort and chest fluttering. Her bp last night 130/107 with pulse 77, she went up stairs and repeated at bp 155/130 p 117. She is drinking 3 to 4 Gatorades daily, meds confirmed. She is getting very SOB with walking and her balance is off, she is having dizziness. 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

## 2013-10-25 NOTE — Telephone Encounter (Signed)
Pt called back and states that she [facility] and it was normal.

## 2013-10-25 NOTE — Telephone Encounter (Signed)
Spoke w/ pt.  Advised her that I had spoken w/ Dr. Mariah MillingGollan and he recommends that pt f/u w/ Dr. Graciela HusbandsKlein. She verbalizes understanding and is agreeable to this.

## 2013-10-25 NOTE — Telephone Encounter (Signed)
Spoke with pt, Aware of dr ross recommendation  

## 2013-10-25 NOTE — Telephone Encounter (Signed)
Pt states she has had some fluttering, cp,  and not feeling well, her BP 130/107 at rest, after walking up steps 155/130 and her toes turned blue. Please call.

## 2013-10-25 NOTE — Telephone Encounter (Signed)
Discussed with dr Tenny Crawross, Spoke with pt, she just got home. She cont to be dizzy. Her bp at work was 133/96 this morning. She is going to try to do orthostatics at home and call me back.

## 2013-10-25 NOTE — Telephone Encounter (Signed)
New problem ° ° °Pt having sob and chest pain °

## 2013-10-25 NOTE — Telephone Encounter (Addendum)
Dr Tenny Crawross reviewed bps Pt to start propranolol 10 mg bid Left message for pt to call

## 2013-10-25 NOTE — Telephone Encounter (Signed)
Patient is calling back with results. Please call back.

## 2013-10-25 NOTE — Telephone Encounter (Signed)
Spoke w/ Sandra Myers.  She reports that for about the past week to week and half, she has been experiencing fluttering sensation in her chest. Reports that her BP has been elevated, worse when she exerts herself. Reports that her toes turn blue when this happens.  Sandra Myers is currently on her way to work [facility] [employer].  Denies chest pain, SOB, but does report continued fluttering sensation. She states that she can have a co-worker perform an EKG on her and have results accessible to us.  Will await these results and forward to Dr. Mariah MillingGollan for his review and recommendation.

## 2013-10-29 ENCOUNTER — Ambulatory Visit (INDEPENDENT_AMBULATORY_CARE_PROVIDER_SITE_OTHER): Payer: 59 | Admitting: Cardiovascular Disease

## 2013-10-29 ENCOUNTER — Encounter: Payer: Self-pay | Admitting: Cardiovascular Disease

## 2013-10-29 ENCOUNTER — Encounter (INDEPENDENT_AMBULATORY_CARE_PROVIDER_SITE_OTHER): Payer: Self-pay

## 2013-10-29 VITALS — BP 130/88 | HR 91 | Ht 68.0 in | Wt 224.0 lb

## 2013-10-29 DIAGNOSIS — R002 Palpitations: Secondary | ICD-10-CM

## 2013-10-29 DIAGNOSIS — I951 Orthostatic hypotension: Secondary | ICD-10-CM

## 2013-10-29 DIAGNOSIS — R209 Unspecified disturbances of skin sensation: Secondary | ICD-10-CM | POA: Insufficient documentation

## 2013-10-29 DIAGNOSIS — I498 Other specified cardiac arrhythmias: Secondary | ICD-10-CM

## 2013-10-29 DIAGNOSIS — R6889 Other general symptoms and signs: Secondary | ICD-10-CM

## 2013-10-29 DIAGNOSIS — R000 Tachycardia, unspecified: Secondary | ICD-10-CM

## 2013-10-29 DIAGNOSIS — R079 Chest pain, unspecified: Secondary | ICD-10-CM

## 2013-10-29 DIAGNOSIS — R42 Dizziness and giddiness: Secondary | ICD-10-CM

## 2013-10-29 NOTE — Assessment & Plan Note (Signed)
She is very concerned about discoloration of her toes and fingers. She has good extremity pulses. Unable to exclude mild Raynaud's though no significant discoloration noted on today's visit. Encouraged her to keep her extremities warm for now.

## 2013-10-29 NOTE — Assessment & Plan Note (Signed)
Blood pressure is stable on midodrine and Florinef. She only takes midodrine once a day. In general when she checks her pressures, he has been relatively stable with systolic pressure in the 130 range or higher

## 2013-10-29 NOTE — Assessment & Plan Note (Signed)
Previous negative stress echocardiogram with good exercise tolerance.   she is having atypical chest pain, no further workup at this time. Unable to exclude coronary spasm.

## 2013-10-29 NOTE — Patient Instructions (Signed)
You are doing well. We have ordered a holter monitor for tachycardia, dizziness  Once the holter is complete, Ok to try bystolic 5 mg once a day for heart rate control Take propranolol as needed for breakthrough tachycardia  App store search for pulse meter Download: instant heart rate  Please call us if you have new issues that need to be addressed before your next appt.

## 2013-10-29 NOTE — Assessment & Plan Note (Signed)
Holter monitor has been ordered given symptoms of tachycardia with chest tightness. She has not tried any beta blockers yet. Following the Holter, we have suggested she start bystolic 5 mg daily and take propranolol as needed for breakthrough palpitations and tachycardia. These may need to be titrated upwards for symptom relief. 30 day monitor could be ordered if needed if we're unable to capture her tachycardia.

## 2013-10-29 NOTE — Progress Notes (Signed)
Patient ID: Sandra Myers, female    DOB: 28-Oct-1980, 33 y.o.   MRN: [id number]  HPI Comments: Sandra Myers is a very pleasant 33 year old woman who works at [employer] [facility] [employer] [facility] with a long history of symptomatic orthostatic hypotension since she was age 33, with progressively worsening symptoms over the past several years who presents for routine followup.   Prior stress echocardiogram was essentially normal with no evidence of ischemia. She achieved heart rate of 150 beats per minute with no symptoms of chest pain.  Blood pressure did not move beyond systolic 122 the entire exercise. And into recovery. In fact it was 2 episodes of low blood pressure midway through stage II and the very end of recovery at 5 minutes she had blood pressure dropped to 100 systolic and felt dizzy   In followup today, she reports that she has had significant symptoms of shortness of breath, tachycardia, chest pain over the past week or so. Blood pressures have been significantly improved on Florinef and midodrine. She takes midodrine in the morning but forgets to take his later in the day. She does report having consistent pressures in the 130 range. She continues to have episodes of dizziness, most often when she stands. Most bothered by tachycardia. She was given propranolol after being seen by Dr. Tenny Crawoss but she has not tried this yet. She reports significant stress as she is starting school again.  She does report significant stress and anxiety. Less chest pain on Celexa and Klonopin   EKG today shows normal sinus rhythm with rate 72 beats per minute, no significant ST or T wave changes  Orthostatics in the office today showed stable blood pressure in the 130 range, heart rates increased from 67 up to 90s with standing from a supine position   Outpatient Encounter Prescriptions as of 10/29/2013  Medication Sig  . augmented betamethasone dipropionate (DIPROLENE) 0.05 % ointment Apply topically 2 (two) times daily.  .  citalopram (CELEXA) 40 MG tablet Take 1 tablet (40 mg total) by mouth daily.  . clonazePAM (KLONOPIN) 1 MG tablet TAKE 1 TABLET BY MOUTH 3 TIMES DAILY AS NEEDED FOR ANXIETY  . cyclobenzaprine (FLEXERIL) 5 MG tablet Take 1-2 tablets (5-10 mg total) by mouth at bedtime.  . fludrocortisone (FLORINEF) 0.1 MG tablet Take 1 tablet (0.1 mg total) by mouth daily.  . midodrine (PROAMATINE) 10 MG tablet Take one tablet (10 mg total) by mouth 3 (three) times daily - at 7am, 11am, and 3 pm.  . traZODone (DESYREL) 50 MG tablet Take 150 mg by mouth at bedtime.   . propranolol (INDERAL) 10 MG tablet Take 1 tablet (10 mg total) by mouth 2 (two) times daily.     Review of Systems  Constitutional: Negative.   HENT: Negative.   Eyes: Negative.   Respiratory: Positive for chest tightness and shortness of breath.   Cardiovascular: Positive for palpitations.  Gastrointestinal: Negative.   Endocrine: Negative.   Musculoskeletal: Negative.   Skin: Negative.   Allergic/Immunologic: Negative.   Neurological: Positive for dizziness and light-headedness.       Near syncope  Hematological: Negative.   Psychiatric/Behavioral: Negative.   All other systems reviewed and are negative.   BP 130/88  Pulse 91  Ht 5\' 8"  (1.727 m)  Wt 224 lb (101.606 kg)  BMI 34.07 kg/m2  Physical Exam  Nursing note and vitals reviewed. Constitutional: She is oriented to person, place, and time. She appears well-developed and well-nourished.  HENT:  Head: Normocephalic.  Nose:  Nose normal.  Mouth/Throat: Oropharynx is clear and moist.  Eyes: Conjunctivae are normal. Pupils are equal, round, and reactive to light.  Neck: Normal range of motion. Neck supple. No JVD present.  Cardiovascular: Normal rate, regular rhythm, S1 normal, S2 normal, normal heart sounds and intact distal pulses.  Exam reveals no gallop and no friction rub.   No murmur heard. Pulmonary/Chest: Effort normal and breath sounds normal. No respiratory  distress. She has no wheezes. She has no rales. She exhibits no tenderness.  Abdominal: Soft. Bowel sounds are normal. She exhibits no distension. There is no tenderness.  Musculoskeletal: Normal range of motion. She exhibits no edema and no tenderness.  Lymphadenopathy:    She has no cervical adenopathy.  Neurological: She is alert and oriented to person, place, and time. Coordination normal.  Skin: Skin is warm and dry. No rash noted. No erythema.  Psychiatric: She has a normal mood and affect. Her behavior is normal. Judgment and thought content normal.    Assessment and Plan

## 2013-10-31 ENCOUNTER — Encounter (INDEPENDENT_AMBULATORY_CARE_PROVIDER_SITE_OTHER): Payer: 59

## 2013-10-31 ENCOUNTER — Encounter: Payer: Self-pay | Admitting: *Deleted

## 2013-10-31 DIAGNOSIS — R002 Palpitations: Secondary | ICD-10-CM

## 2013-10-31 DIAGNOSIS — R42 Dizziness and giddiness: Secondary | ICD-10-CM

## 2013-10-31 NOTE — Progress Notes (Signed)
Patient ID: Sandra BarrySarah B Agredano, female   DOB: 1981-05-06, 33 y.o.   MRN: [id number] E-Cardio 48 hour holter monitor applied to patient.

## 2013-11-08 ENCOUNTER — Other Ambulatory Visit: Payer: Self-pay

## 2013-11-08 ENCOUNTER — Telehealth: Payer: Self-pay

## 2013-11-08 MED ORDER — TRAZODONE HCL 50 MG PO TABS: 150.0000 mg | ORAL_TABLET | Freq: Every day | ORAL | Status: AC

## 2013-11-08 NOTE — Telephone Encounter (Signed)
Talked to pt who stated pharm faxed req bc Dr Ann MakiParrish used to Rx this. We did not receive the fax, but advised pt that I will send req to Maralyn SagoSarah to review. Maralyn SagoSarah, do you want to Rx for pt?

## 2013-11-08 NOTE — Telephone Encounter (Signed)
Benny LennertSARAH WEBER - PT says pharmacy sent over request for trazodone on Monday and have not heard back from us.  Can we please check on this?  [phone]

## 2013-11-08 NOTE — Telephone Encounter (Signed)
Notified pt on VM Rx was approved and sent.

## 2013-11-27 ENCOUNTER — Encounter: Payer: Self-pay | Admitting: Physician Assistant

## 2013-11-27 ENCOUNTER — Encounter: Payer: Self-pay | Admitting: Cardiovascular Disease

## 2013-11-27 ENCOUNTER — Ambulatory Visit (INDEPENDENT_AMBULATORY_CARE_PROVIDER_SITE_OTHER): Payer: 59 | Admitting: Physician Assistant

## 2013-11-27 ENCOUNTER — Ambulatory Visit: Payer: 59

## 2013-11-27 VITALS — BP 120/84 | HR 82 | Temp 99.5°F | Resp 16 | Ht 67.0 in | Wt 225.0 lb

## 2013-11-27 DIAGNOSIS — F32A Depression, unspecified: Secondary | ICD-10-CM

## 2013-11-27 DIAGNOSIS — F329 Major depressive disorder, single episode, unspecified: Secondary | ICD-10-CM

## 2013-11-27 DIAGNOSIS — R21 Rash and other nonspecific skin eruption: Secondary | ICD-10-CM

## 2013-11-27 DIAGNOSIS — F3289 Other specified depressive episodes: Secondary | ICD-10-CM

## 2013-11-27 DIAGNOSIS — M546 Pain in thoracic spine: Secondary | ICD-10-CM

## 2013-11-27 MED ORDER — CLONAZEPAM 1 MG PO TABS
1.0000 mg | ORAL_TABLET | Freq: Every day | ORAL | Status: DC
Start: 2013-11-27 — End: 2014-03-06

## 2013-11-27 MED ORDER — CITALOPRAM HYDROBROMIDE 40 MG PO TABS: 40.0000 mg | ORAL_TABLET | Freq: Every day | ORAL | Status: DC

## 2013-11-27 MED ORDER — BETAMETHASONE DIPROPIONATE AUG 0.05 % EX OINT: TOPICAL_OINTMENT | Freq: Two times a day (BID) | CUTANEOUS | Status: DC

## 2013-11-27 NOTE — Progress Notes (Signed)
   Subjective:    Patient ID: Sandra Myers, female    DOB: 1981-02-26, 33 y.o.   MRN: [id number]  HPI  Pt presents to clinic for a med refill.  She is doing good. Her anxiety is well controlled with Celexa and Klonopin in the am. She is no longer doing therapy because her therapist moved but they felt that she was ok to not continue.  She is in a good place.  She has met with her biological father 3 times in the past 5 months.  She is in a good steady relationship.  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  She plans on starting a diet for weight loss next week and plans to change eating habits and add exercise.  She has her partner are starting this together.  She has continued to see the cardiologist - she wore a heart monitor but has not heard the results.  She has been having some back pain - thoracic region - seems worse with certain activities.  She takes the Flexeril at night prn.  She does to a chiropractor and she displaces a rib on the right and when that happens she has elbow pain.  She has been having this back pain for a while.   Review of Systems     Objective:   Physical Exam  Vitals reviewed. Constitutional: She is oriented to person, place, and time. She appears well-developed and well-nourished.  HENT:  Head: Normocephalic and atraumatic.  Right Ear: External ear normal.  Left Ear: External ear normal.  Pulmonary/Chest: Effort normal.  Musculoskeletal:       Back:  Neurological: She is alert and oriented to person, place, and time.  Skin: Skin is warm and dry.  Psychiatric: She has a normal mood and affect. Her behavior is normal. Judgment and thought content normal.   [facility] reading (PRIMARY) by  Dr. Laney Pastor.  ? abnl T5 and T6 with degenerative changes at joint with rib on T5 L side    Assessment & Plan:  Depression - Continue current meds - Plan: citalopram (CELEXA) 40 MG tablet, clonazePAM  (KLONOPIN) 1 MG tablet  Rash - dyshidrotic eczema - Plan: augmented betamethasone dipropionate (DIPROLENE) 0.05 % ointment  Thoracic back pain - Plan: DG Thoracic Spine 2 View - ? Congenital abnormality - we will send xray to radiology for an over-read - it may warrant an MRI.  Windell Hummingbird PA-C  [facility] 11/27/2013 7:03 PM

## 2013-12-26 NOTE — Telephone Encounter (Signed)
No msg °

## 2014-01-08 ENCOUNTER — Telehealth: Payer: Self-pay

## 2014-01-08 DIAGNOSIS — R000 Tachycardia, unspecified: Secondary | ICD-10-CM

## 2014-01-08 NOTE — Telephone Encounter (Signed)
Pt called, states she is ready to get a 30 day monitor. Please call.

## 2014-01-08 NOTE — Telephone Encounter (Signed)
Spoke w/ pt.  Advised her that [facility] event [facility]. Asked to call w/ any questions or concerns.

## 2014-01-11 ENCOUNTER — Emergency Department (HOSPITAL_COMMUNITY)
Admission: EM | Admit: 2014-01-11 | Discharge: 2014-01-11 | Disposition: A | Discharge status: Home / Self Care | Payer: PRIVATE HEALTH INSURANCE | Attending: Emergency Medicine | Admitting: Emergency Medicine

## 2014-01-11 ENCOUNTER — Emergency Department (HOSPITAL_COMMUNITY): Payer: PRIVATE HEALTH INSURANCE

## 2014-01-11 ENCOUNTER — Encounter (HOSPITAL_COMMUNITY): Payer: Self-pay | Admitting: Emergency Medicine

## 2014-01-11 DIAGNOSIS — IMO0002 Reserved for concepts with insufficient information to code with codable children: Secondary | ICD-10-CM | POA: Insufficient documentation

## 2014-01-11 DIAGNOSIS — Z79899 Other long term (current) drug therapy: Secondary | ICD-10-CM | POA: Insufficient documentation

## 2014-01-11 DIAGNOSIS — Z8679 Personal history of other diseases of the circulatory system: Secondary | ICD-10-CM | POA: Insufficient documentation

## 2014-01-11 DIAGNOSIS — F329 Major depressive disorder, single episode, unspecified: Secondary | ICD-10-CM | POA: Insufficient documentation

## 2014-01-11 DIAGNOSIS — F3289 Other specified depressive episodes: Secondary | ICD-10-CM | POA: Insufficient documentation

## 2014-01-11 DIAGNOSIS — F411 Generalized anxiety disorder: Secondary | ICD-10-CM | POA: Insufficient documentation

## 2014-01-11 DIAGNOSIS — Y9289 Other specified places as the place of occurrence of the external cause: Secondary | ICD-10-CM | POA: Insufficient documentation

## 2014-01-11 DIAGNOSIS — S60221A Contusion of right hand, initial encounter: Secondary | ICD-10-CM

## 2014-01-11 DIAGNOSIS — W208XXA Other cause of strike by thrown, projected or falling object, initial encounter: Secondary | ICD-10-CM | POA: Insufficient documentation

## 2014-01-11 DIAGNOSIS — F17200 Nicotine dependence, unspecified, uncomplicated: Secondary | ICD-10-CM | POA: Insufficient documentation

## 2014-01-11 DIAGNOSIS — S60229A Contusion of unspecified hand, initial encounter: Secondary | ICD-10-CM | POA: Insufficient documentation

## 2014-01-11 DIAGNOSIS — Y9389 Activity, other specified: Secondary | ICD-10-CM | POA: Insufficient documentation

## 2014-01-11 NOTE — ED Provider Notes (Signed)
CSN: [id number]     Arrival date & time 01/11/14  0004 History   First MD Initiated Contact with Patient 01/11/14 0026     Chief Complaint  Patient presents with  . Hand Injury     (Consider location/radiation/quality/duration/timing/severity/associated sxs/prior Treatment) HPI  Patient presents with pain over the dorsum of her right hand. States that when she got to work today a lid top fell on her hand twice. States that she felt that it was a "zinger" and would go away but has actually been constant since around 11:00pm. The pain is moderate, 7/10, exacerbated by palpation. Denies weakness or numbness of the hand. Denies any other symptoms. Denies other injury.  Past Medical History  Diagnosis Date  . Back pain   . Depression   . Anxiety   . Syncope and collapse   . HA (headache)   . Syncope   . Orthostatic hypotension    Past Surgical History  Procedure Laterality Date  . Facial reconstruction surgery  2012, 2013    3 surgeries  . Appendectomy  Age 7  . Tonsillectomy and adenoidectomy  Age 24   Family History  Problem Relation Age of Onset  . Bipolar disorder Cousin   . Hyperlipidemia Mother   . Hypertension Mother   . Hypertension Maternal Grandmother   . Hyperlipidemia Maternal Grandfather   . Other Maternal Grandfather     carotoid stentosis  . Depression Father   . Cancer Father     skin cancer, not melanoma  . Depression Brother   . Cancer Paternal Grandmother     uterine cancer  . Cancer Paternal Grandfather     colon cancer   History  Substance Use Topics  . Smoking status: Current Every Day Smoker -- 1.00 packs/day    Types: Cigarettes    Last Attempt to Quit: 01/16/2003  . Smokeless tobacco: Never Used     Comment: Patient quit in 2004 but recently started back 3 months ago.   . Alcohol Use: No     Comment: OCCASIONALLY, h/o misuse for anxiety    OB History   Grav Para Term Preterm Abortions TAB SAB Ect Mult Living                 Review  of Systems  Constitutional: Negative for fever.  Musculoskeletal: Positive for arthralgias.  Skin: Negative for color change and wound.  Allergic/Immunologic: Negative for immunocompromised state.  Neurological: Negative for weakness and numbness.  Hematological: Does not bruise/bleed easily.  Psychiatric/Behavioral: Negative for self-injury.      Allergies  Review of patient's allergies indicates no known allergies.  Home Medications   Prior to Admission medications   Medication Sig Start Date End Date Taking? Authorizing Provider  augmented betamethasone dipropionate (DIPROLENE) 0.05 % ointment Apply topically 2 (two) times daily. 11/27/13   Morrell Riddle, PA-C  citalopram (CELEXA) 40 MG tablet Take 1 tablet (40 mg total) by mouth daily. 11/27/13   Morrell Riddle, PA-C  clonazePAM (KLONOPIN) 1 MG tablet Take 1 tablet (1 mg total) by mouth daily. 11/27/13   Morrell Riddle, PA-C  cyclobenzaprine (FLEXERIL) 5 MG tablet Take 1-2 tablets (5-10 mg total) by mouth at bedtime. 08/21/13   Morrell Riddle, PA-C  fludrocortisone (FLORINEF) 0.1 MG tablet Take 1 tablet (0.1 mg total) by mouth daily. 06/05/13   Antonieta Iba, MD  midodrine (PROAMATINE) 10 MG tablet Take one tablet (10 mg total) by mouth 3 (three) times daily - at  7am, 11am, and 3 pm. 06/13/13   Duke SalviaSteven C Klein, MD  propranolol (INDERAL) 10 MG tablet Take 1 tablet (10 mg total) by mouth 2 (two) times daily. 10/25/13   Pricilla RifflePaula Ross V, MD  traZODone (DESYREL) 50 MG tablet Take 3 tablets (150 mg total) by mouth at bedtime. 11/08/13   Morrell RiddleSarah L Weber, PA-C   BP 145/104  Pulse 77  Temp(Src) 98.3 F (36.8 C) (Oral)  Resp 18  Ht 5\' 7"  (1.702 m)  Wt 228 lb 4 oz (103.534 kg)  BMI 35.74 kg/m2  SpO2 96%  LMP 01/01/2014 Physical Exam  Nursing note and vitals reviewed. Constitutional: She appears well-developed and well-nourished. No distress.  HENT:  Head: Normocephalic and atraumatic.  Neck: Neck supple.  Pulmonary/Chest: Effort normal.    Musculoskeletal: Normal range of motion.       Right hand: She exhibits tenderness. She exhibits normal range of motion, normal capillary refill, no deformity, no laceration and no swelling. Normal sensation noted. Normal strength noted.       Hands: Neurological: She is alert.  Skin: She is not diaphoretic.    ED Course  Procedures (including critical care time) Labs Review Labs Reviewed - No data to display  Imaging Review Dg Hand Complete Right  01/11/2014   CLINICAL DATA:  Right hand trauma, pain  EXAM: RIGHT HAND - COMPLETE 3+ VIEW  COMPARISON:  None.  FINDINGS: There is no evidence of fracture or dislocation. There is no evidence of arthropathy or other focal bone abnormality. Soft tissues are unremarkable.  IMPRESSION: Negative.   Electronically Signed   By: Christiana PellantGretchen  Green M.D.   On: 01/11/2014 00:39     EKG Interpretation None      MDM   Final diagnoses:  Hand contusion, right, initial encounter   Patient with injury to hand while at work today.  Neurovascularly intact.  No break in skin.  Xray negative.  Given ice pack.  Pt declined medications.  Discussed result, findings, treatment, and follow up  with patient.  Pt given return precautions.  Pt verbalizes understanding and agrees with plan.        Trixie Dredgemily Shanesha Bednarz, PA-C 01/11/14 0102

## 2014-01-11 NOTE — ED Notes (Signed)
Ice pack applied to site

## 2014-01-11 NOTE — ED Provider Notes (Signed)
Medical screening examination/treatment/procedure(s) were performed by non-physician practitioner and as supervising physician I was immediately available for consultation/collaboration.   EKG Interpretation None       Kanye Depree M Terisha Losasso, MD 01/11/14 0715 

## 2014-01-11 NOTE — Discharge Instructions (Signed)
Read the information below.  You may return to the Emergency Department at any time for worsening condition or any new symptoms that concern you.  Use Tylenol or ibuprofen as needed as well as ice packs for pain.  If you develop uncontrolled pain, weakness or numbness of the extremity, severe discoloration of the skin, or you are unable to move your hand, return to the ER for a recheck.      Hand Contusion A hand contusion is a deep bruise on your hand area. Contusions are the result of an injury that caused bleeding under the skin. The contusion may turn blue, purple, or yellow. Minor injuries will give you a painless contusion, but more severe contusions may stay painful and swollen for a few weeks. CAUSES  A contusion is usually caused by a blow, trauma, or direct force to an area of the body. SYMPTOMS   Swelling and redness of the injured area.  Discoloration of the injured area.  Tenderness and soreness of the injured area.  Pain. DIAGNOSIS  The diagnosis can be made by taking a history and performing a physical exam. An X-ray, CT scan, or MRI may be needed to determine if there were any associated injuries, such as broken bones (fractures). TREATMENT  Often, the best treatment for a hand contusion is resting, elevating, icing, and applying cold compresses to the injured area. Over-the-counter medicines may also be recommended for pain control. HOME CARE INSTRUCTIONS   Put ice on the injured area.  Put ice in a plastic bag.  Place a towel between your skin and the bag.  Leave the ice on for 15-20 minutes, 03-04 times a day.  Only take over-the-counter or prescription medicines as directed by your caregiver. Your caregiver may recommend avoiding anti-inflammatory medicines (aspirin, ibuprofen, and naproxen) for 48 hours because these medicines may increase bruising.  If told, use an elastic wrap as directed. This can help reduce swelling. You may remove the wrap for sleeping,  showering, and bathing. If your fingers become numb, cold, or blue, take the wrap off and reapply it more loosely.  Elevate your hand with pillows to reduce swelling.  Avoid overusing your hand if it is painful. SEEK IMMEDIATE MEDICAL CARE IF:   You have increased redness, swelling, or pain in your hand.  Your swelling or pain is not relieved with medicines.  You have loss of feeling in your hand or are unable to move your fingers.  Your hand turns cold or blue.  You have pain when you move your fingers.  Your hand becomes warm to the touch.  Your contusion does not improve in 2 days. MAKE SURE YOU:   Understand these instructions.  Will watch your condition.  Will get help right away if you are not doing well or get worse. Document Released: 12/10/2001 Document Revised: 03/14/2012 Document Reviewed: 12/12/2011 [facility] Patient Information 2015 [employer] [place]. This information is not intended to replace advice given to you by your health care provider. Make sure you discuss any questions you have with your health care provider.

## 2014-01-11 NOTE — ED Notes (Signed)
The linen cart lid fell onto her rt hand x 2 tonight at work.  She works Customer service managerupstairs.  lmp July 2

## 2014-01-21 DIAGNOSIS — R000 Tachycardia, unspecified: Secondary | ICD-10-CM

## 2014-01-22 ENCOUNTER — Other Ambulatory Visit: Payer: Self-pay | Admitting: Cardiovascular Disease

## 2014-02-21 ENCOUNTER — Telehealth: Payer: Self-pay

## 2014-02-21 NOTE — Telephone Encounter (Signed)
Spoke w/ pt.  Advised her of 30 day event monitor results:  "NSR w/ no significant arrhythmia".  She verbalizes understanding and will call w/ any questions or concerns.

## 2014-02-24 ENCOUNTER — Other Ambulatory Visit: Payer: Self-pay

## 2014-02-24 ENCOUNTER — Ambulatory Visit (INDEPENDENT_AMBULATORY_CARE_PROVIDER_SITE_OTHER): Payer: 59

## 2014-02-24 DIAGNOSIS — R000 Tachycardia, unspecified: Secondary | ICD-10-CM

## 2014-02-24 DIAGNOSIS — I498 Other specified cardiac arrhythmias: Secondary | ICD-10-CM

## 2014-03-06 ENCOUNTER — Other Ambulatory Visit: Payer: Self-pay | Admitting: Physician Assistant

## 2014-03-06 NOTE — Telephone Encounter (Signed)
Called in.

## 2014-03-06 NOTE — Telephone Encounter (Signed)
Done - please phone to the pharmacy.

## 2014-04-15 ENCOUNTER — Other Ambulatory Visit: Payer: Self-pay | Admitting: Physician Assistant

## 2014-04-17 NOTE — Telephone Encounter (Signed)
Done

## 2014-04-21 NOTE — Telephone Encounter (Signed)
Script faxed.

## 2014-05-15 ENCOUNTER — Ambulatory Visit: Payer: 59 | Admitting: Physician Assistant

## 2014-06-03 ENCOUNTER — Other Ambulatory Visit: Payer: Self-pay | Admitting: Physician Assistant

## 2014-06-03 NOTE — Telephone Encounter (Signed)
Done

## 2014-06-04 NOTE — Telephone Encounter (Signed)
Called in.

## 2014-06-12 IMAGING — CR DG THORACIC SPINE 2V
2 series · 2 of 2 positions shown · non-contrast
Comparison: Chest radiographs 01/12/2013

CLINICAL DATA: Thoracic back pain

EXAM:
THORACIC SPINE - 2 VIEW

[AP]
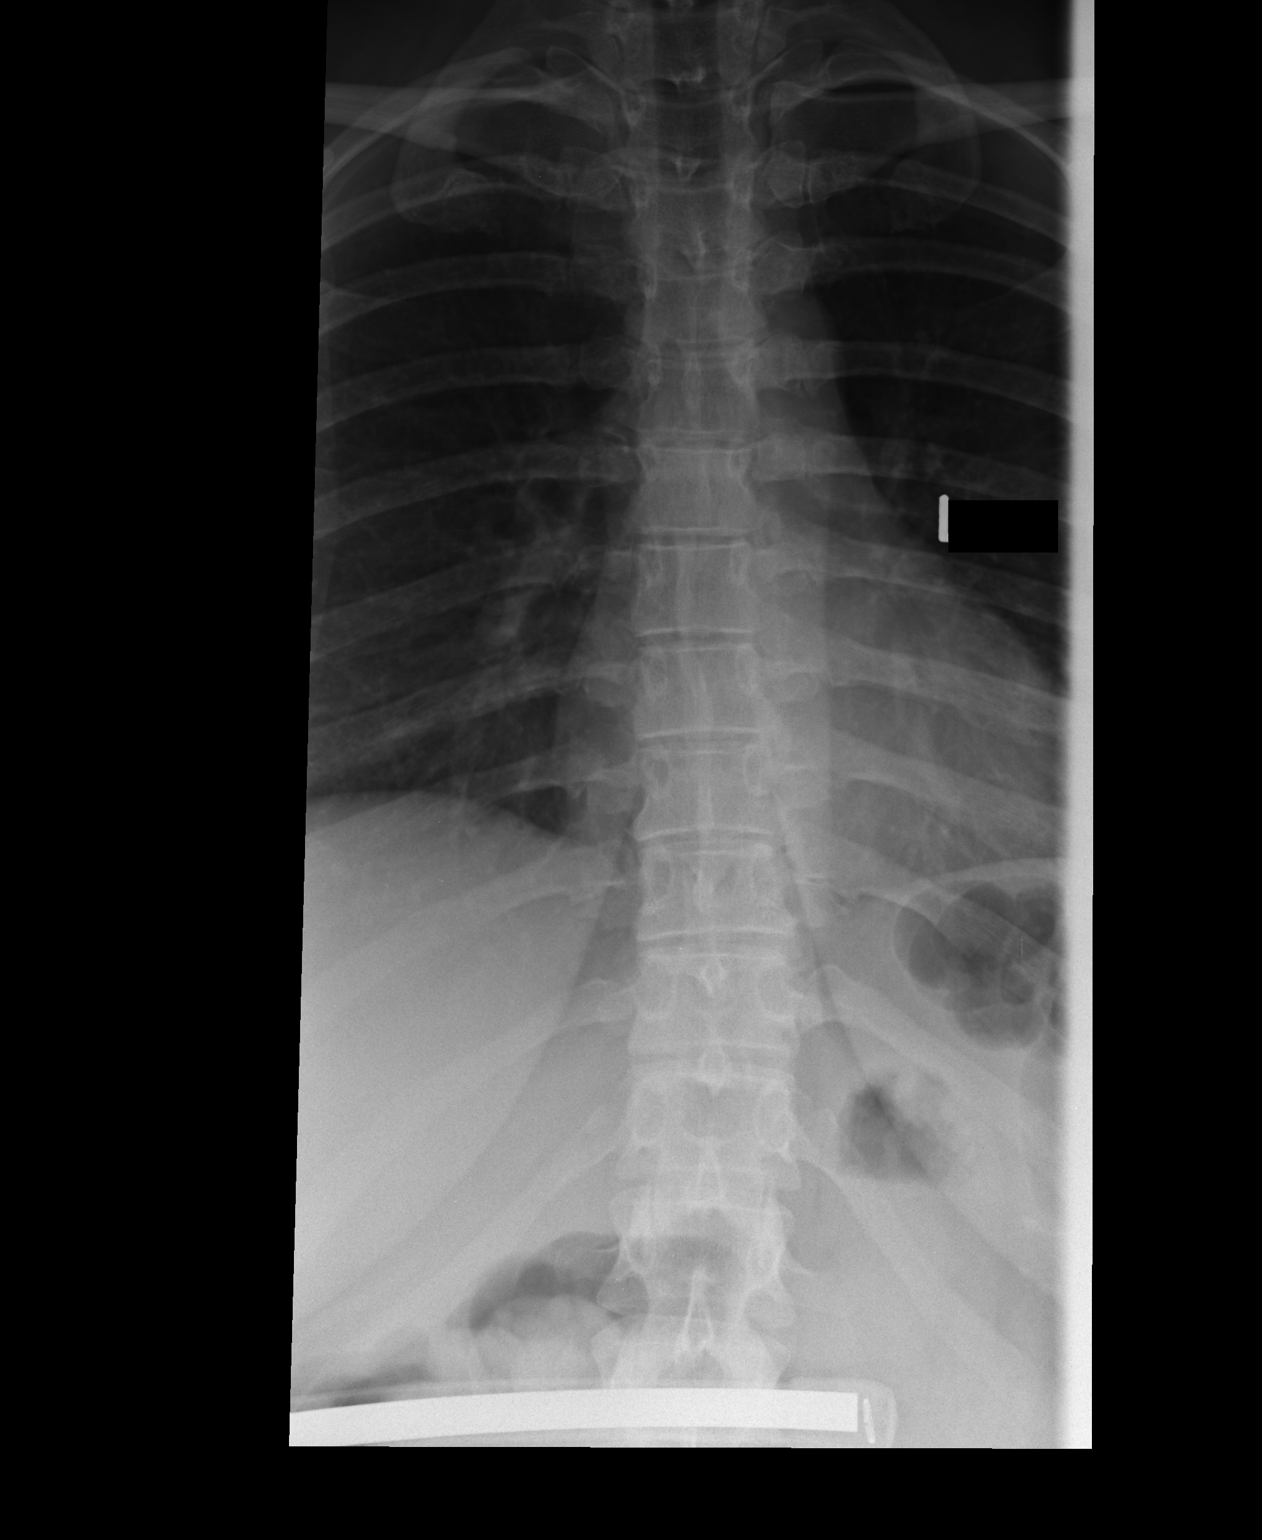

[lateral]
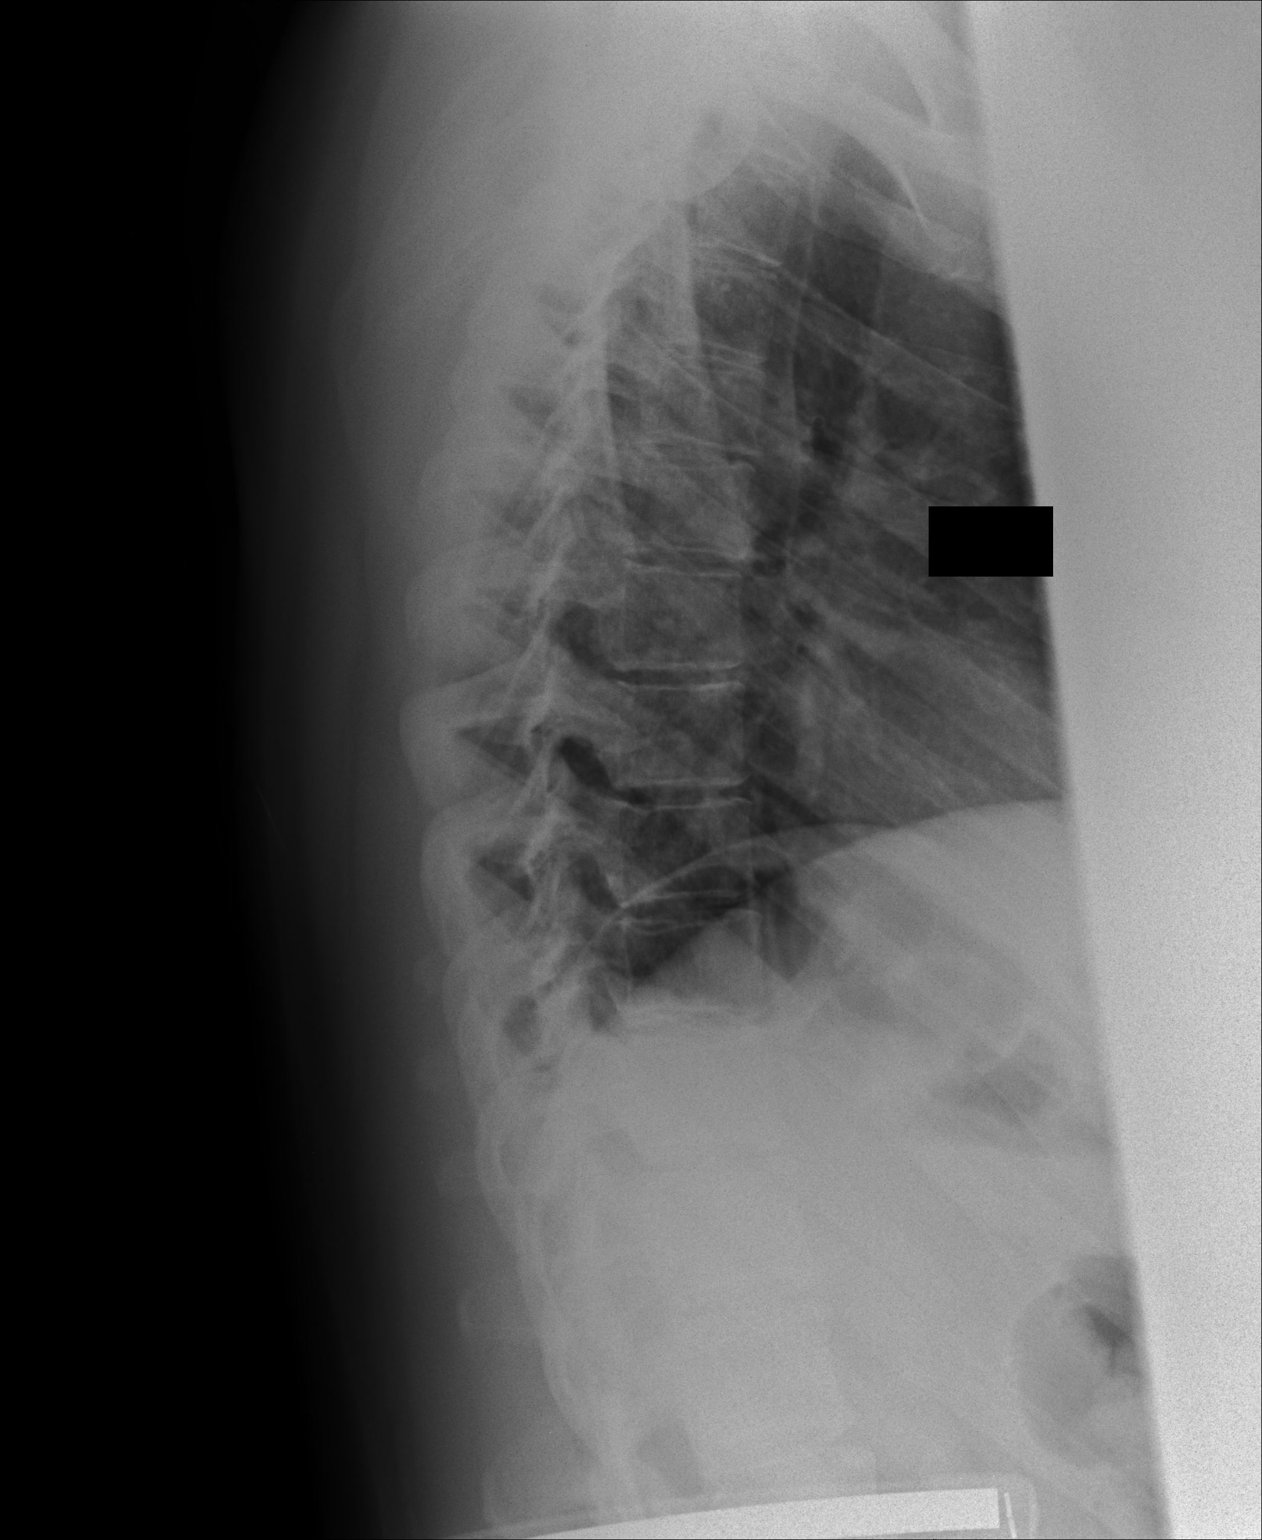

[2 of 2 positions shown; findings below may reference images not displayed]

FINDINGS: Twelve pairs of ribs.

Osseous mineralization normal.

Visualized posterior ribs unremarkable.

Vertebral body and disk space heights maintained.

No acute fracture, subluxation or bone destruction.

Appearance is similar to that on prior chest radiographs.

No widening of paraspinal lines.

Very minimal levoconvex thoracolumbar scoliosis.
IMPRESSION: Minimal levoconvex scoliosis at thoracolumbar spine.

Otherwise negative exam.

## 2014-07-05 ENCOUNTER — Other Ambulatory Visit: Payer: Self-pay | Admitting: Physician Assistant

## 2014-07-08 NOTE — Telephone Encounter (Signed)
Please let the patient know I refilled her Klonopin - It is time for an OV.

## 2014-07-09 NOTE — Telephone Encounter (Signed)
Faxed Rx. Notified pt of need for f/up on VM. 

## 2014-07-27 IMAGING — CR DG HAND COMPLETE 3+V*R*
3 series · 3 of 3 positions shown · non-contrast
Comparison: None.

CLINICAL DATA: Right hand trauma, pain

EXAM:
RIGHT HAND - COMPLETE 3+ VIEW

[x hand pa right]
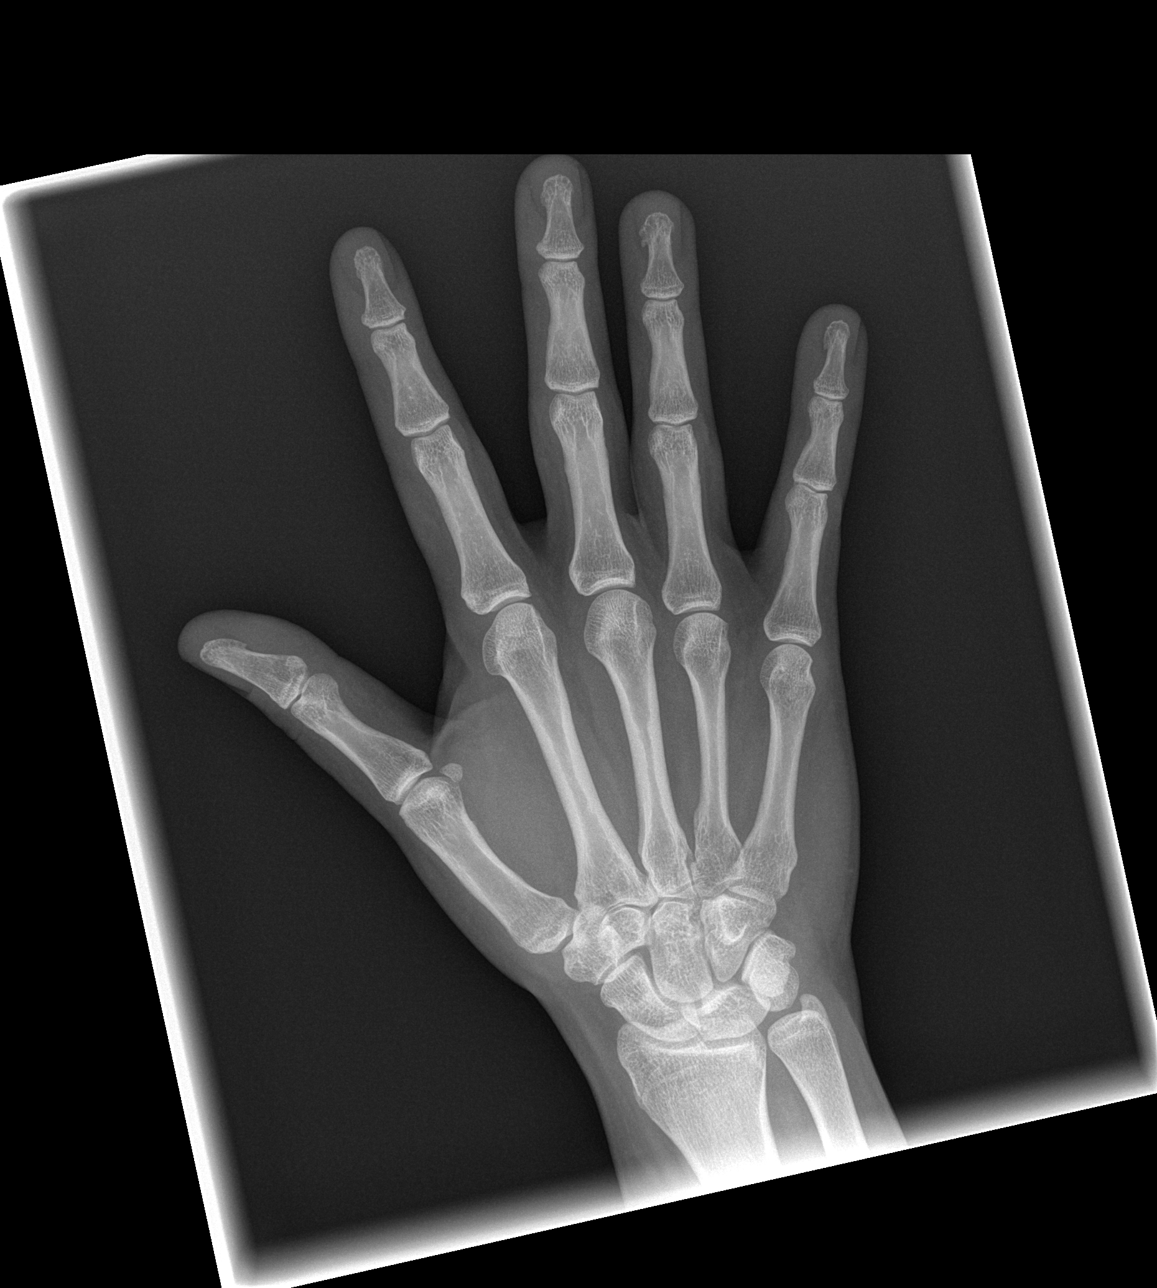

[x hand oblique right]
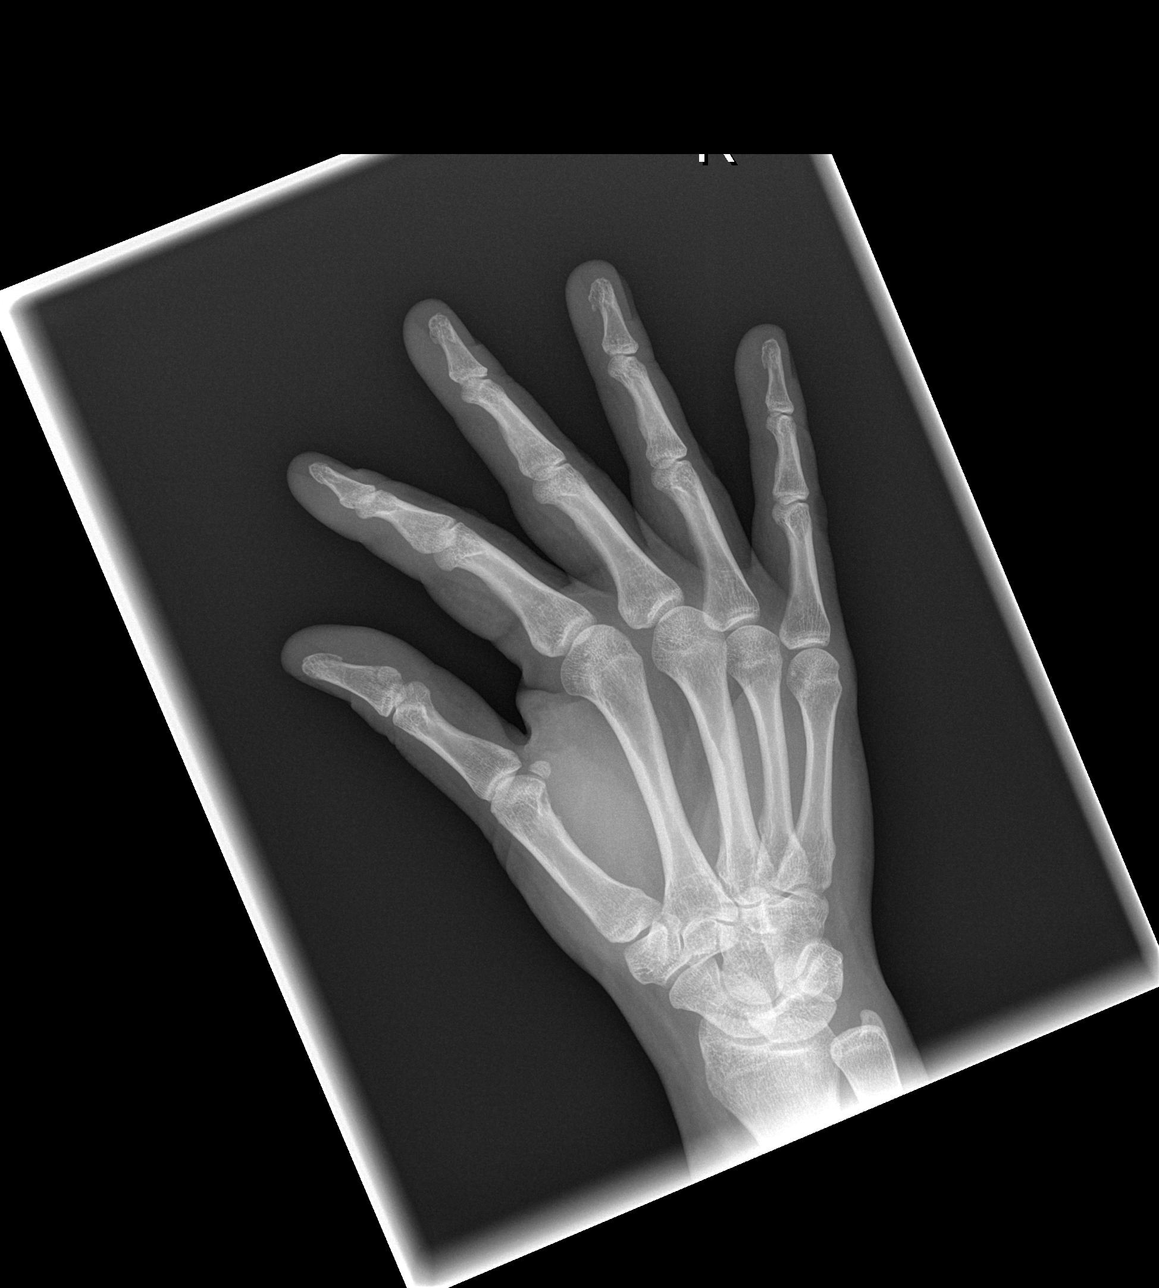

[x hand lat right]
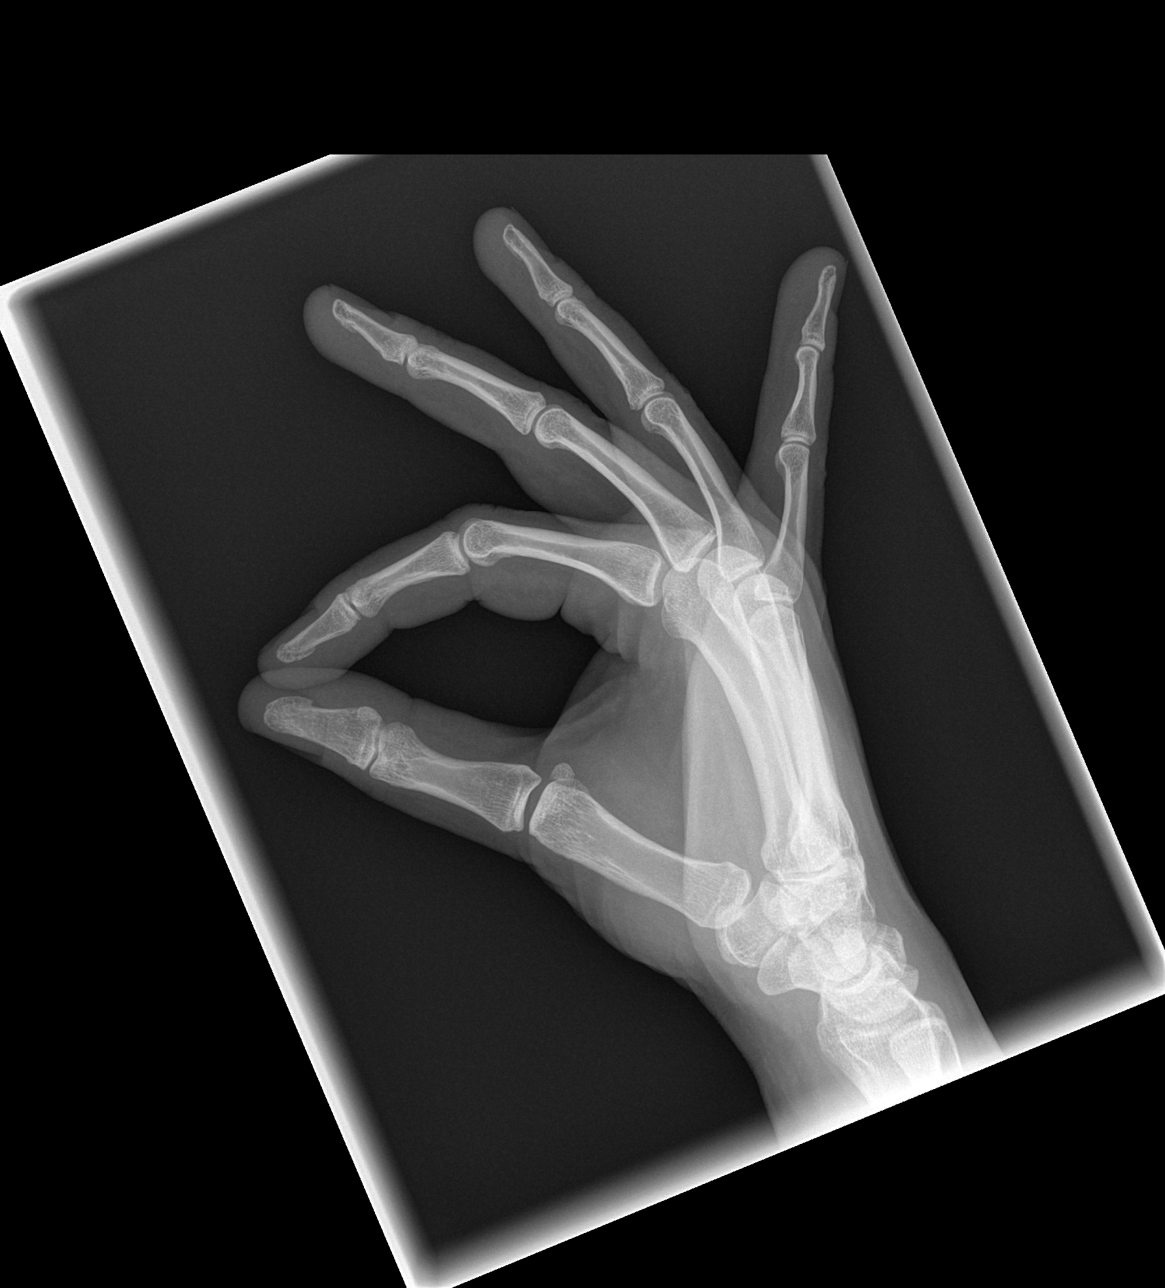

[3 of 3 positions shown; findings below may reference images not displayed]

FINDINGS: There is no evidence of fracture or dislocation. There is no
evidence of arthropathy or other focal bone abnormality. Soft
tissues are unremarkable.
IMPRESSION: Negative.

## 2014-08-18 ENCOUNTER — Other Ambulatory Visit: Payer: Self-pay

## 2014-08-18 DIAGNOSIS — M546 Pain in thoracic spine: Secondary | ICD-10-CM

## 2014-08-18 DIAGNOSIS — R21 Rash and other nonspecific skin eruption: Secondary | ICD-10-CM

## 2014-08-18 DIAGNOSIS — F329 Major depressive disorder, single episode, unspecified: Secondary | ICD-10-CM

## 2014-08-18 DIAGNOSIS — F32A Depression, unspecified: Secondary | ICD-10-CM

## 2014-08-18 NOTE — Telephone Encounter (Signed)
The following email was submitted to your website from Craig GuessSarah Cassells I need my prescriptions refilled and the last time I had them refilled I got a message about needing another appointment before my prescriptions could be refilled.  [place] not in [place] at this time, and will not be there for a while.  I am a travelling Scientist, forensichealth care worker.  My condition has not changed for which my prescriptions are needed.  Could you please ask Benny LennertSarah Weber if she would be willing to fill my prescriptions and [place] the [place] to [place] pharmacy [place].  [facility] [address], [place], [place] [id number].  Thank you for your help.  Date of birth: [birth year]

## 2014-08-20 NOTE — Telephone Encounter (Signed)
Called pt who verified she need RFs of citalopram, clonazepam, diprolene (she said this worked but she ran out and would like to try it again) and flexeril. Pended but haven't changed # or RFs

## 2014-08-21 MED ORDER — CYCLOBENZAPRINE HCL 5 MG PO TABS: 5.0000 mg | ORAL_TABLET | Freq: Every day | ORAL | Status: DC

## 2014-08-21 MED ORDER — CLONAZEPAM 1 MG PO TABS
1.0000 mg | ORAL_TABLET | Freq: Every day | ORAL | Status: AC | PRN
Start: 2014-08-21 — End: ?

## 2014-08-21 MED ORDER — BETAMETHASONE DIPROPIONATE AUG 0.05 % EX OINT
TOPICAL_OINTMENT | Freq: Two times a day (BID) | CUTANEOUS | Status: AC
Start: 2014-08-21 — End: ?

## 2014-08-21 MED ORDER — CITALOPRAM HYDROBROMIDE 40 MG PO TABS: 40.0000 mg | ORAL_TABLET | Freq: Every day | ORAL | Status: AC

## 2014-08-21 NOTE — Telephone Encounter (Signed)
About time for a visit.

## 2014-08-22 NOTE — Telephone Encounter (Signed)
Faxed clonazepam and notified pt on VM of RFs and need for f/up.

## 2014-08-26 ENCOUNTER — Telehealth: Payer: Self-pay | Admitting: *Deleted

## 2014-08-26 DIAGNOSIS — M546 Pain in thoracic spine: Secondary | ICD-10-CM

## 2014-08-26 MED ORDER — MIDODRINE HCL 10 MG PO TABS: ORAL_TABLET | ORAL | Status: AC

## 2014-08-26 MED ORDER — CYCLOBENZAPRINE HCL 5 MG PO TABS: 5.0000 mg | ORAL_TABLET | Freq: Every day | ORAL | Status: AC

## 2014-08-26 NOTE — Telephone Encounter (Signed)
Pt called and reported that pharm states they do not have the Rx for clonazepam we faxed. I called pharm and gave VO on their doctor VM.

## 2014-08-26 NOTE — Telephone Encounter (Signed)
Refill sent for Midodrine/Fludrocortisone to [facility] in [place].

## 2014-08-26 NOTE — Telephone Encounter (Signed)
°  1. Which medications need to be refilled? Midodrine, Fludrocortisone   2. Which pharmacy is medication to be sent to? [facility] [phone] in [place] [address]   3. Do they need a 30 day or 90 day supply? 30 for now. New ins doesn't start until 1st  4. Would they like a call back once the medication has been sent to the pharmacy? Yes

## 2014-09-03 ENCOUNTER — Other Ambulatory Visit: Payer: Self-pay | Admitting: *Deleted

## 2014-09-03 ENCOUNTER — Telehealth: Payer: Self-pay | Admitting: *Deleted

## 2014-09-03 MED ORDER — FLUDROCORTISONE ACETATE 0.1 MG PO TABS: 100.0000 ug | ORAL_TABLET | Freq: Every day | ORAL | Status: AC

## 2014-09-03 NOTE — Telephone Encounter (Signed)
Rx sent to local pharmacy left message to notify patient.

## 2014-09-03 NOTE — Telephone Encounter (Signed)
°  1. Which medications need to be refilled? fludrocortisone  2. Which pharmacy is medication to be sent to? [facility] in [place]  3. Do they need a 30 day or 90 day supply? 90 day  4. Would they like a call back once the medication has been sent to the pharmacy? Yes please

## 2015-05-20 ENCOUNTER — Encounter: Payer: Self-pay | Admitting: Physician Assistant

## 2015-05-26 NOTE — Telephone Encounter (Signed)
Please fax the letter to the patient.  Fax to [phone]
